# Patient Record
Sex: Female | Born: 1995 | Race: Black or African American | Hispanic: No | Marital: Single | Smoking: Current every day smoker
Health system: Southern US, Community
[De-identification: ages and names within clinical notes are randomized; demographics above are authoritative.]

## PROBLEM LIST (undated history)

## (undated) DIAGNOSIS — N83209 Unspecified ovarian cyst, unspecified side: Secondary | ICD-10-CM

---
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
## 2018-01-16 ENCOUNTER — Encounter: Payer: Self-pay | Admitting: Emergency Medicine

## 2018-01-16 ENCOUNTER — Emergency Department (HOSPITAL_COMMUNITY)
Admission: EM | Admit: 2018-01-16 | Discharge: 2018-01-16 | Disposition: A | Discharge status: Home / Self Care | Payer: Medicaid Other | Attending: Emergency Medicine | Admitting: Emergency Medicine

## 2018-01-16 ENCOUNTER — Other Ambulatory Visit: Payer: Self-pay

## 2018-01-16 DIAGNOSIS — B9689 Other specified bacterial agents as the cause of diseases classified elsewhere: Secondary | ICD-10-CM | POA: Insufficient documentation

## 2018-01-16 DIAGNOSIS — N760 Acute vaginitis: Secondary | ICD-10-CM | POA: Insufficient documentation

## 2018-01-16 DIAGNOSIS — N898 Other specified noninflammatory disorders of vagina: Secondary | ICD-10-CM | POA: Diagnosis present

## 2018-01-16 DIAGNOSIS — A6004 Herpesviral vulvovaginitis: Secondary | ICD-10-CM | POA: Diagnosis not present

## 2018-01-16 DIAGNOSIS — F17200 Nicotine dependence, unspecified, uncomplicated: Secondary | ICD-10-CM | POA: Diagnosis not present

## 2018-01-16 LAB — WET PREP, GENITAL
Sperm: NONE SEEN
TRICH WET PREP: NONE SEEN
YEAST WET PREP: NONE SEEN

## 2018-01-16 LAB — POC URINE PREG, ED: PREG TEST UR: NEGATIVE

## 2018-01-16 MED ORDER — METRONIDAZOLE 500 MG PO TABS: 500.0000 mg | ORAL_TABLET | Freq: Two times a day (BID) | ORAL | 0 refills | Status: DC

## 2018-01-16 MED ORDER — CEFTRIAXONE SODIUM 250 MG IJ SOLR
250.0000 mg | Freq: Once | INTRAMUSCULAR | Status: AC
  Administered 2018-01-16: 250 mg via INTRAMUSCULAR
  Filled 2018-01-16: qty 250

## 2018-01-16 MED ORDER — AZITHROMYCIN 250 MG PO TABS
1000.0000 mg | ORAL_TABLET | Freq: Once | ORAL | Status: AC
  Administered 2018-01-16: 1000 mg via ORAL
  Filled 2018-01-16: qty 4

## 2018-01-16 MED ORDER — VALACYCLOVIR HCL 1 G PO TABS: 1000.0000 mg | ORAL_TABLET | Freq: Two times a day (BID) | ORAL | 0 refills | Status: AC

## 2018-01-16 MED ORDER — LIDOCAINE HCL (PF) 1 % IJ SOLN
INTRAMUSCULAR | Status: AC
  Administered 2018-01-16: 5 mL
  Filled 2018-01-16: qty 5

## 2018-01-16 NOTE — Discharge Instructions (Addendum)
Take Valtrex 1 g twice a day for 7 days.  You will also need to take Flagyl twice a day for 1 week. We will contact you with the results of your remaining lab work when it is available. Return to ED for worsening symptoms, abnormal bleeding, worsening infection.

## 2018-01-16 NOTE — ED Notes (Addendum)
Discharge instructions given to patient. Prescriptions reviewed with patient. Patient removed printed prescription from paperwork and requested RN to shred rest due to not wanting others to see her information.

## 2018-01-16 NOTE — ED Triage Notes (Signed)
Patient presents ambulatory c/o vaginal burning and itching onset of yesterday. Denies any discharge or urinary frequency. Reports recent sexual activity with protection.

## 2018-01-16 NOTE — ED Provider Notes (Signed)
[facility] EMERGENCY DEPARTMENT Provider Note   CSN: [id number] Arrival date & time: 01/16/18  1811     History   Chief Complaint Chief Complaint  Patient presents with  . Vaginal Itching    HPI Amber Hendrix is a 22 y.o. female who presents to ED for vaginal itching that began yesterday.  States that she recently had protected sexual intercourse with a female partner.  She denies history of similar symptoms in the past.  Denies any dysuria, vaginal discharge, abnormal bleeding or possibility of pregnancy.  HPI  History reviewed. No pertinent past medical history.  There are no active problems to display for this patient.   History reviewed. No pertinent surgical history.   OB History   None      Home Medications    Prior to Admission medications   Medication Sig Start Date End Date Taking? Authorizing Provider  metroNIDAZOLE (FLAGYL) 500 MG tablet Take 1 tablet (500 mg total) by mouth 2 (two) times daily. 01/16/18   Frazier Balfour, PA-C  valACYclovir (VALTREX) 1000 MG tablet Take 1 tablet (1,000 mg total) by mouth 2 (two) times daily for 7 days. 01/16/18 01/23/18  Dietrich Pates, PA-C    Family History No family history on file.  Social History Social History   Tobacco Use  . Smoking status: Current Every Day Smoker  . Smokeless tobacco: Never Used  Substance Use Topics  . Alcohol use: Not Currently  . Drug use: Not Currently     Allergies   Patient has no known allergies.   Review of Systems Review of Systems  Constitutional: Negative for chills and fever.  Genitourinary: Negative for dysuria and vaginal discharge.       + Vaginal itching     Physical Exam Updated Vital Signs BP 131/85 (BP Location: Right Arm)   Pulse 88   Temp 98.3 F (36.8 C) (Oral)   Resp 16   Ht 5' 3.5" (1.613 m)   Wt 50.3 kg   LMP 01/14/2018   SpO2 100%   BMI 19.35 kg/m   Physical Exam  Constitutional: She appears well-developed and  well-nourished. No distress.  HENT:  Head: Normocephalic and atraumatic.  Eyes: Conjunctivae and EOM are normal. No scleral icterus.  Neck: Normal range of motion.  Pulmonary/Chest: Effort normal. No respiratory distress.  Genitourinary:    There is lesion on the right labia.  Genitourinary Comments: Pelvic exam: normal external genitalia without evidence of trauma. VULVA: Vesicular lesions noted on right. VAGINA: normal appearing vagina with normal color and discharge, no lesions. CERVIX: normal appearing cervix without lesions, cervical motion tenderness absent, cervical os closed with out purulent discharge; No vaginal discharge. Wet prep and DNA probe for chlamydia and GC obtained.   ADNEXA: normal adnexa in size, nontender and no masses UTERUS: uterus is normal size, shape, consistency and nontender.   Tech served as Biomedical engineer during exam.  Neurological: She is alert.  Skin: No rash noted. She is not diaphoretic.  Psychiatric: She has a normal mood and affect.  Nursing note and vitals reviewed.    ED Treatments / Results  Labs (all labs ordered are listed, but only abnormal results are displayed) Labs Reviewed  WET PREP, GENITAL - Abnormal; Notable for the following components:      Result Value   Clue Cells Wet Prep HPF POC PRESENT (*)    WBC, Wet Prep HPF POC RARE (*)    All other components within normal limits  HIV ANTIBODY (ROUTINE  TESTING W REFLEX)  RPR  POC URINE PREG, ED  GC/CHLAMYDIA PROBE AMP ([place]) NOT AT [facility]    EKG None  Radiology No results found.  Procedures Procedures (including critical care time)  Medications Ordered in ED Medications  cefTRIAXone (ROCEPHIN) injection 250 mg (250 mg Intramuscular Given 01/16/18 1907)  azithromycin (ZITHROMAX) tablet 1,000 mg (1,000 mg Oral Given 01/16/18 1907)  lidocaine (PF) (XYLOCAINE) 1 % injection (5 mLs  Given 01/16/18 1906)     Initial Impression / Assessment and Plan / ED Course  I have  reviewed the triage vital signs and the nursing notes.  Pertinent labs & imaging results that were available during my care of the patient were reviewed by me and considered in my medical decision making (see chart for details).    22 year old female presents to ED for vaginal itching since yesterday.  Denies any urinary symptoms, vaginal discharge.  She reports having protected sexual intercourse with a female partner recently.  On exam there are vesicular lesions that are concerning for herpes.  Wet prep shows clue cells.  Pregnancy test was negative.  Patient treated prophylactically for gonorrhea and chlamydia and will be given a prescription for Valtrex, Flagyl.  Patient is hemodynamically stable, in NAD, and able to ambulate in the ED. Evaluation does not show pathology that would require ongoing emergent intervention or inpatient treatment. I explained the diagnosis to the patient. Pain has been managed and has no complaints prior to discharge. Patient is comfortable with above plan and is stable for discharge at this time. All questions were answered prior to disposition. Strict return precautions for returning to the ED were discussed. Encouraged follow up with PCP.    Portions of this note were generated with Scientist, clinical (histocompatibility and immunogenetics). Dictation errors may occur despite best attempts at proofreading.   Final Clinical Impressions(s) / ED Diagnoses   Final diagnoses:  Herpes simplex vulvovaginitis  BV (bacterial vaginosis)    ED Discharge Orders         Ordered    valACYclovir (VALTREX) 1000 MG tablet  2 times daily     01/16/18 1908    metroNIDAZOLE (FLAGYL) 500 MG tablet  2 times daily     01/16/18 1908           Dietrich Pates, [place] 01/16/18 1908    Tegeler, Canary Brim, MD 01/16/18 [phone]

## 2018-01-17 LAB — RPR: RPR Ser Ql: NONREACTIVE

## 2018-01-17 LAB — HIV ANTIBODY (ROUTINE TESTING W REFLEX): HIV Screen 4th Generation wRfx: NONREACTIVE

## 2018-01-17 LAB — GC/CHLAMYDIA PROBE AMP (~~LOC~~) NOT AT ARMC
Chlamydia: NEGATIVE
NEISSERIA GONORRHEA: NEGATIVE

## 2018-09-20 ENCOUNTER — Emergency Department (HOSPITAL_COMMUNITY)
Admission: EM | Admit: 2018-09-20 | Discharge: 2018-09-21 | Disposition: A | Discharge status: Home / Self Care | Payer: Medicaid Other | Attending: Emergency Medicine | Admitting: Emergency Medicine

## 2018-09-20 ENCOUNTER — Emergency Department (HOSPITAL_COMMUNITY): Payer: Medicaid Other

## 2018-09-20 ENCOUNTER — Encounter (HOSPITAL_COMMUNITY): Payer: Self-pay

## 2018-09-20 ENCOUNTER — Other Ambulatory Visit: Payer: Self-pay

## 2018-09-20 DIAGNOSIS — N3090 Cystitis, unspecified without hematuria: Secondary | ICD-10-CM | POA: Insufficient documentation

## 2018-09-20 DIAGNOSIS — R1030 Lower abdominal pain, unspecified: Secondary | ICD-10-CM | POA: Diagnosis present

## 2018-09-20 LAB — CBC
HCT: 35 % — ABNORMAL LOW (ref 36.0–46.0)
Hemoglobin: 10.9 g/dL — ABNORMAL LOW (ref 12.0–15.0)
MCH: 26.9 pg (ref 26.0–34.0)
MCHC: 31.1 g/dL (ref 30.0–36.0)
MCV: 86.4 fL (ref 80.0–100.0)
Platelets: 259 10*3/uL (ref 150–400)
RBC: 4.05 MIL/uL (ref 3.87–5.11)
RDW: 15.9 % — ABNORMAL HIGH (ref 11.5–15.5)
WBC: 9.3 10*3/uL (ref 4.0–10.5)
nRBC: 0 % (ref 0.0–0.2)

## 2018-09-20 LAB — COMPREHENSIVE METABOLIC PANEL
ALT: 11 U/L (ref 0–44)
AST: 18 U/L (ref 15–41)
Albumin: 3.9 g/dL (ref 3.5–5.0)
Alkaline Phosphatase: 48 U/L (ref 38–126)
Anion gap: 9 (ref 5–15)
BUN: 8 mg/dL (ref 6–20)
CO2: 23 mmol/L (ref 22–32)
Calcium: 9.1 mg/dL (ref 8.9–10.3)
Chloride: 103 mmol/L (ref 98–111)
Creatinine, Ser: 0.84 mg/dL (ref 0.44–1.00)
GFR calc Af Amer: >60 mL/min (ref >60)
GFR calc non Af Amer: >60 mL/min (ref >60)
Glucose, Bld: 92 mg/dL (ref 70–99)
Potassium: 3.8 mmol/L (ref 3.5–5.1)
Sodium: 135 mmol/L (ref 135–145)
Total Bilirubin: 0.7 mg/dL (ref 0.3–1.2)
Total Protein: 7.6 g/dL (ref 6.5–8.1)

## 2018-09-20 LAB — URINALYSIS, ROUTINE W REFLEX MICROSCOPIC
Bacteria, UA: NONE SEEN
Bilirubin Urine: NEGATIVE
Glucose, UA: NEGATIVE mg/dL
Hgb urine dipstick: NEGATIVE
Ketones, ur: NEGATIVE mg/dL
Nitrite: NEGATIVE
Protein, ur: 30 mg/dL — AB
Specific Gravity, Urine: 1.023 (ref 1.005–1.030)
pH: 7 (ref 5.0–8.0)

## 2018-09-20 LAB — I-STAT BETA HCG BLOOD, ED (MC, WL, AP ONLY): I-stat hCG, quantitative: <5 m[IU]/mL (ref <5)

## 2018-09-20 LAB — LIPASE, BLOOD: Lipase: 23 U/L (ref 11–51)

## 2018-09-20 MED ORDER — ONDANSETRON HCL 4 MG/2ML IJ SOLN
4.0000 mg | Freq: Once | INTRAMUSCULAR | Status: AC
  Administered 2018-09-20: 23:00:00 4 mg via INTRAVENOUS
  Filled 2018-09-20: qty 2

## 2018-09-20 MED ORDER — MORPHINE SULFATE (PF) 4 MG/ML IV SOLN
4.0000 mg | Freq: Once | INTRAVENOUS | Status: AC
  Administered 2018-09-20: 23:00:00 4 mg via INTRAVENOUS
  Filled 2018-09-20: qty 1

## 2018-09-20 MED ORDER — IOHEXOL 300 MG/ML  SOLN
100.0000 mL | Freq: Once | INTRAMUSCULAR | Status: AC | PRN
  Administered 2018-09-20: 23:00:00 100 mL via INTRAVENOUS

## 2018-09-20 MED ORDER — SODIUM CHLORIDE 0.9 % IV SOLN
1.0000 g | Freq: Once | INTRAVENOUS | Status: AC
  Administered 2018-09-21: 1 g via INTRAVENOUS
  Filled 2018-09-20: qty 10

## 2018-09-20 MED ORDER — SODIUM CHLORIDE 0.9% FLUSH: 3.0000 mL | Freq: Once | INTRAVENOUS | Status: DC

## 2018-09-20 NOTE — ED Triage Notes (Signed)
Pt reports sudden onset of RLQ pain last night, area is distended, very tender to touch. Denies n/vd.

## 2018-09-20 NOTE — ED Provider Notes (Signed)
[facility] EMERGENCY DEPARTMENT Provider Note   CSN: [id number] Arrival date & time: 09/20/18  2150     History   Chief Complaint Chief Complaint  Patient presents with  . Abdominal Pain    HPI Amber Hendrix is a 23 y.o. female.     Patient presents to the ED with a chief complaint of abdominal pain.  She states that the pain started yesterday and has been gradually worsening.  She denies any nausea, vomiting, or diarrhea.  Denies any hematuria, dysuria, vaginal discharge or bleeding.  She has not taken anything for her symptoms.  She rates her pain as "making her cry."  She denies any other associated symptoms.  The history is provided by the patient. No language interpreter was used.    History reviewed. No pertinent past medical history.  There are no active problems to display for this patient.   History reviewed. No pertinent surgical history.   OB History   No obstetric history on file.      Home Medications    Prior to Admission medications   Not on File    Family History No family history on file.  Social History Social History   Tobacco Use  . Smoking status: Not on file  Substance Use Topics  . Alcohol use: Not on file  . Drug use: Not on file     Allergies   Patient has no allergy information on record.   Review of Systems Review of Systems  All other systems reviewed and are negative.    Physical Exam Updated Vital Signs BP 110/69 (BP Location: Left Arm)   Pulse 100   Temp 98.7 F (37.1 C) (Oral)   Resp 14   LMP 09/14/2018   SpO2 100%   Physical Exam Vitals signs and nursing note reviewed.  Constitutional:      General: She is not in acute distress.    Appearance: She is well-developed.  HENT:     Head: Normocephalic and atraumatic.  Eyes:     Conjunctiva/sclera: Conjunctivae normal.  Neck:     Musculoskeletal: Neck supple.  Cardiovascular:     Rate and Rhythm: Normal rate and regular rhythm.      Heart sounds: No murmur.  Pulmonary:     Effort: Pulmonary effort is normal. No respiratory distress.     Breath sounds: Normal breath sounds.  Abdominal:     Palpations: Abdomen is soft.     Tenderness: There is abdominal tenderness.     Comments: Suprapubic tenderness  Skin:    General: Skin is warm and dry.  Neurological:     Mental Status: She is alert and oriented to person, place, and time.  Psychiatric:        Mood and Affect: Mood normal.        Behavior: Behavior normal.        Thought Content: Thought content normal.        Judgment: Judgment normal.      ED Treatments / Results  Labs (all labs ordered are listed, but only abnormal results are displayed) Labs Reviewed  CBC - Abnormal; Notable for the following components:      Result Value   Hemoglobin 10.9 (*)    HCT 35.0 (*)    RDW 15.9 (*)    All other components within normal limits  LIPASE, BLOOD  COMPREHENSIVE METABOLIC PANEL  URINALYSIS, ROUTINE W REFLEX MICROSCOPIC  I-STAT BETA HCG BLOOD, ED ([facility], [facility], [facility] ONLY)  EKG None  Radiology No results found.  Procedures Procedures (including critical care time)  Medications Ordered in ED Medications  sodium chloride flush (NS) 0.9 % injection 3 mL (has no administration in time range)  morphine 4 MG/ML injection 4 mg (has no administration in time range)  ondansetron (ZOFRAN) injection 4 mg (has no administration in time range)     Initial Impression / Assessment and Plan / ED Course  I have reviewed the triage vital signs and the nursing notes.  Pertinent labs & imaging results that were available during my care of the patient were reviewed by me and considered in my medical decision making (see chart for details).        Patient with lower abdominal pain.  Symptoms started yesterday.  Pain is mostly in the suprapubic.  She denies any dysuria.  Denies any fever, nausea, or vomiting.  Denies vaginal discharge or bleeding.  We will check CT  to rule out appendicitis.  Urinalysis concerning for UTI.  CT shows inflammatory changes which could be consistent with cystitis.  Also consider PID, however the patient adamantly denies any vaginal discharge.  Doubt abscess.  She is afebrile.  She is nontoxic-appearing.  Will treat for cystitis at this time, and will have patient follow-up with PCP or OB/GYN should she develop vaginal discharge.  Additionally, she has been instructed to return to the emergency department if her symptoms worsen.  Final Clinical Impressions(s) / ED Diagnoses   Final diagnoses:  Cystitis    ED Discharge Orders         Ordered    cephALEXin (KEFLEX) 500 MG capsule  2 times daily     09/21/18 0031    ibuprofen (ADVIL) 800 MG tablet  3 times daily     09/21/18 0031           Roxy HorsemanBrowning, Peityn Payton, PA-C 09/21/18 0034    Gerhard MunchLockwood, Eugina Row, MD 09/26/18 [phone]

## 2018-09-20 NOTE — ED Notes (Signed)
Attempted to obtain IV access x2 unsuccessful. Second RN to attempt

## 2018-09-20 NOTE — ED Notes (Signed)
Patient transported to CT 

## 2018-09-21 ENCOUNTER — Encounter: Payer: Self-pay | Admitting: Emergency Medicine

## 2018-09-21 MED ORDER — IBUPROFEN 800 MG PO TABS: 800.0000 mg | ORAL_TABLET | Freq: Three times a day (TID) | ORAL | 0 refills | Status: DC

## 2018-09-21 MED ORDER — CEPHALEXIN 500 MG PO CAPS: 500.0000 mg | ORAL_CAPSULE | Freq: Two times a day (BID) | ORAL | 0 refills | Status: DC

## 2018-09-21 NOTE — ED Notes (Signed)
Discharge instructions reinforced with pt. Pt states having to drive home to [facility]. Pt to sleep in hallway before going home.

## 2018-09-21 NOTE — ED Notes (Signed)
Discharge instructions discussed with pt. Pt verbalized understanding. No questions at this time   Pending completion of IV antibiotics.

## 2018-09-21 NOTE — ED Notes (Signed)
Pt continues sleeping in hall.

## 2018-10-21 ENCOUNTER — Emergency Department
Admission: EM | Admit: 2018-10-21 | Discharge: 2018-10-21 | Disposition: A | Discharge status: Home / Self Care | Payer: Medicaid Other | Attending: Emergency Medicine | Admitting: Emergency Medicine

## 2018-10-21 ENCOUNTER — Other Ambulatory Visit: Payer: Self-pay

## 2018-10-21 DIAGNOSIS — Y9301 Activity, walking, marching and hiking: Secondary | ICD-10-CM | POA: Insufficient documentation

## 2018-10-21 DIAGNOSIS — F17210 Nicotine dependence, cigarettes, uncomplicated: Secondary | ICD-10-CM | POA: Insufficient documentation

## 2018-10-21 DIAGNOSIS — W01190A Fall on same level from slipping, tripping and stumbling with subsequent striking against furniture, initial encounter: Secondary | ICD-10-CM | POA: Diagnosis not present

## 2018-10-21 DIAGNOSIS — Y929 Unspecified place or not applicable: Secondary | ICD-10-CM | POA: Diagnosis not present

## 2018-10-21 DIAGNOSIS — Y999 Unspecified external cause status: Secondary | ICD-10-CM | POA: Insufficient documentation

## 2018-10-21 DIAGNOSIS — S0181XA Laceration without foreign body of other part of head, initial encounter: Secondary | ICD-10-CM

## 2018-10-21 MED ORDER — LIDOCAINE-EPINEPHRINE (PF) 2 %-1:200000 IJ SOLN
10.0000 mL | Freq: Once | INTRAMUSCULAR | Status: DC
  Filled 2018-10-21: qty 10

## 2018-10-21 MED ORDER — ONDANSETRON 4 MG PO TBDP
4.0000 mg | ORAL_TABLET | Freq: Once | ORAL | Status: AC
  Administered 2018-10-21: 16:00:00 4 mg via ORAL
  Filled 2018-10-21: qty 1

## 2018-10-21 MED ORDER — LIDOCAINE HCL (PF) 1 % IJ SOLN
5.0000 mL | Freq: Once | INTRAMUSCULAR | Status: AC
  Administered 2018-10-21: 5 mL
  Filled 2018-10-21: qty 5

## 2018-10-21 MED ORDER — NAPROXEN 500 MG PO TABS
500.0000 mg | ORAL_TABLET | Freq: Once | ORAL | Status: DC
  Filled 2018-10-21: qty 1

## 2018-10-21 MED ORDER — ALPRAZOLAM 0.5 MG PO TABS
0.5000 mg | ORAL_TABLET | Freq: Once | ORAL | Status: DC
  Filled 2018-10-21: qty 1

## 2018-10-21 MED ORDER — ALPRAZOLAM 0.5 MG PO TABS
0.5000 mg | ORAL_TABLET | Freq: Once | ORAL | Status: AC
  Administered 2018-10-21: 0.5 mg via ORAL
  Filled 2018-10-21: qty 1

## 2018-10-21 NOTE — Discharge Instructions (Signed)
We repaired your facial laceration with dissolvable sutures and wound glue on the skin. Keep the are clean and dry. Avoid using creams, lotions, oils, or ointments on the wound glue. The glue will crack and peel off in a week or so. Follow-up with one of the local community clinics as needed. Take Tylenol or Motrin for headache pain relief.

## 2018-10-21 NOTE — ED Provider Notes (Signed)
[facility] Department Provider Note ____________________________________________  Time seen: 1530  I have reviewed the triage vital signs and the nursing notes.  HISTORY  Chief Complaint  Fall and Facial Laceration  HPI Amber Hendrix is a 23 y.o. female presents for a facial laceration after contusion. She recalls a mechanical fall where she hit her face on a wooden chair. She sustained a deep laceration over the right brow and the central forehead. She denies LOC, vomiting, or other injury. Initial care and eval provided by my colleague. I will assume repair of the suture at the request of the patient.   History reviewed. No pertinent past medical history.  There are no active problems to display for this patient.  History reviewed. No pertinent surgical history.  Prior to Admission medications   Not on File    Allergies Patient has no known allergies.  No family history on file.  Social History Social History   Tobacco Use  . Smoking status: Current Every Day Smoker  . Smokeless tobacco: Never Used  Substance Use Topics  . Alcohol use: Not Currently  . Drug use: Not Currently    Review of Systems  Constitutional: Negative for fever. Eyes: Negative for visual changes. ENT: Negative for sore throat. Cardiovascular: Negative for chest pain. Respiratory: Negative for shortness of breath. Gastrointestinal: Negative for abdominal pain, vomiting and diarrhea. Genitourinary: Negative for dysuria. Musculoskeletal: Negative for back pain. Skin: Negative for rash. Facial lac as above Neurological: Negative for headaches, focal weakness or numbness. ____________________________________________  PHYSICAL EXAM:  VITAL SIGNS: ED Triage Vitals  Enc Vitals Group     BP 10/21/18 1259 110/77     Pulse Rate 10/21/18 1259 86     Resp 10/21/18 1259 18     Temp 10/21/18 1259 98.4 Hendrix (36.9 C)     Temp Source 10/21/18 1259 Oral   SpO2 10/21/18 1259 99 %     Weight 10/21/18 1300 110 lb (49.9 kg)     Height 10/21/18 1300 5\' 4"  (1.626 m)     Head Circumference --      Peak Flow --      Pain Score 10/21/18 1259 10     Pain Loc --      Pain Edu? --      Excl. in [place]? --     Constitutional: Alert and oriented. Well appearing and in no distress. Head: Normocephalic and atraumatic, except for a large wound vertical lie across the central forehead at the medial right brow.  Subcutaneous fascia is visible in the wound bed.  No active bleeding is appreciated.. Eyes: Conjunctivae are normal. PERRL. Normal extraocular movements Ears: Canals clear. TMs intact bilaterally. Nose: No congestion/rhinorrhea/epistaxis. Mouth/Throat: Mucous membranes are moist. Neck: Supple. Normal ROM Cardiovascular: Normal rate, regular rhythm. Normal distal pulses. Respiratory: Normal respiratory effort. Musculoskeletal: Nontender with normal range of motion in all extremities.  Neurologic: CN II-XII grossly intact. Normal gait without ataxia. Normal speech and language. No gross focal neurologic deficits are appreciated. Skin:  Skin is warm, dry and intact. No rash noted. Psychiatric: Mood and affect are normal. Patient exhibits appropriate insight and judgment. ____________________________________________  PROCEDURES  Xanax 0.5 mg PO Zofran 4 mg ODT  .Marland KitchenLaceration Repair  Date/Time: 10/21/2018 3:35 PM Performed by: Melvenia Needles, PA-C Authorized by: Melvenia Needles, PA-C   Consent:    Consent obtained:  Verbal   Consent given by:  Patient   Risks discussed:  Poor wound healing  and poor cosmetic result Anesthesia (see MAR for exact dosages):    Anesthesia method:  Local infiltration   Local anesthetic:  Lidocaine 1% w/o epi Laceration details:    Location:  Face   Face location:  Forehead   Length (cm):  3   Depth (mm):  3 Pre-procedure details:    Preparation:  Patient was prepped and draped in usual sterile  fashion Exploration:    Contaminated: no   Treatment:    Area cleansed with:  Saline and Betadine   Amount of cleaning:  Standard Skin repair:    Repair method:  Sutures and tissue adhesive   Suture size:  6-0   Suture material:  Fast-absorbing gut   Suture technique:  Subcuticular and running   Number of sutures:  1 Approximation:    Approximation:  Close Post-procedure details:    Patient tolerance of procedure:  Tolerated well, no immediate complications  ____________________________________________  INITIAL IMPRESSION / ASSESSMENT AND PLAN / ED COURSE  Amber Hendrix was evaluated in Emergency Department on 10/21/2018 for the symptoms described in the history of present illness. She was evaluated in the context of the global COVID-19 pandemic, which necessitated consideration that the patient might be at risk for infection with the SARS-CoV-2 virus that causes COVID-19. Institutional protocols and algorithms that pertain to the evaluation of patients at risk for COVID-19 are in a state of rapid change based on information released by regulatory bodies including the CDC and federal and state organizations. These policies and algorithms were followed during the patient's care in the ED.  Patient with ED evaluation of a facial laceration after a mechanical fall. I assumed care for the laceration repair. She patient is discharged to the care of her sister with wound care instructions.  ____________________________________________  FINAL CLINICAL IMPRESSION(S) / ED DIAGNOSES  Final diagnoses:  Facial laceration, initial encounter      Amber [facility]rix, Amber Canevari V Bacon, PA-C 10/21/18 1631    Arnaldo NatalMalinda, Paul F, MD 10/21/18 2306

## 2018-10-21 NOTE — ED Notes (Signed)
Went into room to give pain medication. Pt not in room. Per HouseKeeping pt was seen walking down hall towards discharge.

## 2018-10-21 NOTE — ED Provider Notes (Signed)
[facility] Emergency Department Provider Note  ____________________________________________  Time seen: Approximately 1:52 PM  I have reviewed the triage vital signs and the nursing notes.   HISTORY  Chief Complaint Fall and Facial Laceration   HPI Amber Hendrix is a 23 y.o. female who presents to the emergency department for treatment and evaluation of a laceration above the right eyebrow. She tripped, fell and hit her head on a chair. No alleviating measures prior to arrival.  History reviewed. No pertinent past medical history.  There are no active problems to display for this patient.   History reviewed. No pertinent surgical history.  Prior to Admission medications   Not on File    Allergies Patient has no known allergies.  No family history on file.  Social History Social History   Tobacco Use  . Smoking status: Current Every Day Smoker  . Smokeless tobacco: Never Used  Substance Use Topics  . Alcohol use: Not Currently  . Drug use: Not Currently    Review of Systems  Constitutional: Negative for fever. Respiratory: Negative for cough or shortness of breath.  Musculoskeletal: Negative for myalgias Skin: Positive for facial laceration.  Neurological: Negative for numbness or paresthesias. ____________________________________________   PHYSICAL EXAM:  VITAL SIGNS: ED Triage Vitals  Enc Vitals Group     BP 10/21/18 1259 110/77     Pulse Rate 10/21/18 1259 86     Resp 10/21/18 1259 18     Temp 10/21/18 1259 98.4 F (36.9 C)     Temp Source 10/21/18 1259 Oral     SpO2 10/21/18 1259 99 %     Weight 10/21/18 1300 110 lb (49.9 kg)     Height 10/21/18 1300 5\' 4"  (1.626 m)     Head Circumference --      Peak Flow --      Pain Score 10/21/18 1259 10     Pain Loc --      Pain Edu? --      Excl. in [place]? --      Constitutional: Well appearing. Eyes: Conjunctivae are clear without discharge or drainage. Nose: No  rhinorrhea noted. Mouth/Throat: Airway is patent.  Neck: No stridor. Unrestricted range of motion observed. Cardiovascular: Capillary refill is <3 seconds.  Respiratory: Respirations are even and unlabored.. Musculoskeletal: Unrestricted range of motion observed. Neurologic: Awake, alert, and oriented x 4.  Skin:  3cm laceration to the forehead.  ____________________________________________   LABS (all labs ordered are listed, but only abnormal results are displayed)  Labs Reviewed - No data to display ____________________________________________  EKG  Not indicated. ____________________________________________  RADIOLOGY  Not indicated. ____________________________________________   PROCEDURES  Procedures ____________________________________________   INITIAL IMPRESSION / ASSESSMENT AND PLAN / ED COURSE  Amber [facility]rix is a 23 y.o. female who presents to the ER after falling and hitting her head. She has a 3cm laceration to the right forehead next to the eyebrow on the medial aspect. While trying to get the area numbed up, she started screaming for me to stop and was pulling away. I stopped and she continued to scream at me for being "negative". I am unsure what exactly caused her reaction and attempted to apologize. She was initially going to just leave, but I advised her that she would have a very bad scar if she didn't get it taken care of.  She is refusing to have the sutures inserted by me. She has asked for pain medication and something for anxiety which  I have ordered. Will await another provider to insert sutures. She states that she will wait.   ----------------------------------------- 2:39 PM on 10/21/2018 -----------------------------------------  Patient seen walking out of the department with her belongings.   ----------------------------------------- 3:10 PM on 10/21/2018 ----------------------------------------- Patient returned to the  ER. Patient care transferred to [facility], PA-C.  Medications - No data to display   Pertinent labs & imaging results that were available during my care of the patient were reviewed by me and considered in my medical decision making (see chart for details).  ____________________________________________   FINAL CLINICAL IMPRESSION(S) / ED DIAGNOSES  Final diagnoses:  Facial laceration, initial encounter    ED Discharge Orders    None       Note:  This document was prepared using Dragon voice recognition software and may include unintentional dictation errors.   Victorino Dike, FNP 10/21/18 1512    Delman Kitten, MD 10/21/18 [phone]

## 2018-10-21 NOTE — ED Notes (Signed)
See triage note   States she tripped    Hitting face on chair  Laceration noted to forehead  No LOC and denies any other pain

## 2018-10-21 NOTE — ED Triage Notes (Signed)
Reports trip and fall at home, causing her to hit forehead on wooden chair. Laceration above right eyebrow. No LOC. Pt alert and oriented X4, active, cooperative, pt in NAD. RR even and unlabored, color WNL.

## 2018-10-21 NOTE — ED Notes (Signed)
Pt returned from lobby   Placed in room   Provider aware

## 2019-03-26 ENCOUNTER — Emergency Department
Admission: EM | Admit: 2019-03-26 | Discharge: 2019-03-26 | Disposition: A | Discharge status: Home / Self Care | Payer: Medicaid Other | Attending: Emergency Medicine | Admitting: Emergency Medicine

## 2019-03-26 ENCOUNTER — Other Ambulatory Visit: Payer: Self-pay

## 2019-03-26 DIAGNOSIS — N898 Other specified noninflammatory disorders of vagina: Secondary | ICD-10-CM | POA: Diagnosis present

## 2019-03-26 DIAGNOSIS — F17200 Nicotine dependence, unspecified, uncomplicated: Secondary | ICD-10-CM | POA: Insufficient documentation

## 2019-03-26 MED ORDER — SULFAMETHOXAZOLE-TRIMETHOPRIM 800-160 MG PO TABS: 1.0000 | ORAL_TABLET | Freq: Two times a day (BID) | ORAL | 0 refills | Status: DC

## 2019-03-26 NOTE — ED Triage Notes (Signed)
Pt arrived via POV with reports of a "hair bump" around pubic area x 2 days. Pt denies any drainage c/o pain.

## 2019-03-26 NOTE — ED Provider Notes (Signed)
[facility] Emergency Department Provider Note  ____________________________________________  Time seen: Approximately 8:08 PM  I have reviewed the triage vital signs and the nursing notes.   HISTORY  Chief Complaint Abscess (Pubic)    HPI Amber Hendrix is a 24 y.o. female that presents to the emergency department for evaluation of lump to left labia for 3 days.  Patient states that she recently changed her soap.  Patient shaves area.  She has had some itching around her pubic hair.  She has never had anything like this before.  No new vaginal discharge or vaginal itching.  No fevers.  No past medical history on file.  There are no problems to display for this patient.   No past surgical history on file.  Prior to Admission medications   Medication Sig Start Date End Date Taking? Authorizing Provider  sulfamethoxazole-trimethoprim (BACTRIM DS) 800-160 MG tablet Take 1 tablet by mouth 2 (two) times daily. 03/26/19   Laban Emperor, PA-C    Allergies Patient has no known allergies.  No family history on file.  Social History Social History   Tobacco Use  . Smoking status: Current Every Day Smoker  . Smokeless tobacco: Never Used  Substance Use Topics  . Alcohol use: Not Currently  . Drug use: Not Currently     Review of Systems  Constitutional: No fever/chills Respiratory: No SOB. Gastrointestinal: No abdominal pain.  No nausea, no vomiting.  Musculoskeletal: Negative for musculoskeletal pain. Skin: Negative for abrasions, lacerations, ecchymosis.    ____________________________________________   PHYSICAL EXAM:  VITAL SIGNS: ED Triage Vitals  Enc Vitals Group     BP 03/26/19 1855 120/73     Pulse Rate 03/26/19 1855 62     Resp 03/26/19 1855 18     Temp 03/26/19 1855 98.2 F (36.8 C)     Temp Source 03/26/19 1855 Oral     SpO2 03/26/19 1855 100 %     Weight 03/26/19 1856 120 lb (54.4 kg)     Height 03/26/19 1856 5\' 3"   (1.6 m)     Head Circumference --      Peak Flow --      Pain Score 03/26/19 1855 5     Pain Loc --      Pain Edu? --      Excl. in [place]? --      Constitutional: Alert and oriented. Well appearing and in no acute distress. Eyes: Conjunctivae are normal. PERRL. EOMI. Head: Atraumatic. ENT:      Ears:      Nose: No congestion/rhinnorhea.      Mouth/Throat: Mucous membranes are moist.  Neck: No stridor.  Cardiovascular: Normal rate, regular rhythm.  Good peripheral circulation. Respiratory: Normal respiratory effort without tachypnea or retractions. Lungs CTAB. Good air entry to the bases with no decreased or absent breath sounds. Musculoskeletal: Full range of motion to all extremities. No gross deformities appreciated. Genitourinary: Pea-sized mass proximal to left vaginal opening.  No tenderness to palpation.  No drainage.  No fluctuance.  No vesicles or lesions. Neurologic:  Normal speech and language. No gross focal neurologic deficits are appreciated.  Skin:  Skin is warm, dry. Psychiatric: Mood and affect are normal. Speech and behavior are normal. Patient exhibits appropriate insight and judgement.   ____________________________________________   LABS (all labs ordered are listed, but only abnormal results are displayed)  Labs Reviewed - No data to display ____________________________________________  EKG   ____________________________________________  RADIOLOGY   No results found.  ____________________________________________    PROCEDURES  Procedure(s) performed:    Procedures    Medications - No data to display   ____________________________________________   INITIAL IMPRESSION / ASSESSMENT AND PLAN / ED COURSE  Pertinent labs & imaging results that were available during my care of the patient were reviewed by me and considered in my medical decision making (see chart for details).  Review of the  CSRS was performed in accordance of the [facility]  prior to dispensing any controlled drugs.   Patient presented to emergency department for evaluation of small mass to left labia for 3 days.  Vital signs and exam are reassuring.  Patient has a pea size mass to the left of her vaginal opening.  May be a developing infection from an ingrown hair or Bartholin cyst.  No drainable abscess.  Patient will be discharged home with prescriptions for Bactrim. Patient is to follow up with gynecology as directed. Patient is given ED precautions to return to the ED for any worsening or new symptoms.  Amber Hendrix was evaluated in Emergency Department on 03/26/2019 for the symptoms described in the history of present illness. She was evaluated in the context of the global COVID-19 pandemic, which necessitated consideration that the patient might be at risk for infection with the SARS-CoV-2 virus that causes COVID-19. Institutional protocols and algorithms that pertain to the evaluation of patients at risk for COVID-19 are in a state of rapid change based on information released by regulatory bodies including the CDC and federal and state organizations. These policies and algorithms were followed during the patient's care in the ED.   ____________________________________________  FINAL CLINICAL IMPRESSION(S) / ED DIAGNOSES  Final diagnoses:  Vaginal mass      NEW MEDICATIONS STARTED DURING THIS VISIT:  ED Discharge Orders         Ordered    sulfamethoxazole-trimethoprim (BACTRIM DS) 800-160 MG tablet  2 times daily     03/26/19 2046              This chart was dictated using voice recognition software/Dragon. Despite best efforts to proofread, errors can occur which can change the meaning. Any change was purely unintentional.    Enid Derry, PA-C 03/26/19 2317    Shaune Pollack, MD 03/27/19 [phone]

## 2019-03-26 NOTE — Discharge Instructions (Addendum)
It looks like you are developing a small infection, possibly from an ingrown hair or a clogged gland.  Please change back to your regular soap.  Do not shave.  Apply warm compresses.  Please begin antibiotics.  Please call gynecology tomorrow for a follow-up appointment this week.

## 2019-04-19 ENCOUNTER — Ambulatory Visit: Payer: Medicaid Other | Admitting: Obstetrics and Gynecology

## 2019-05-16 ENCOUNTER — Telehealth: Payer: Self-pay | Admitting: Obstetrics and Gynecology

## 2019-05-16 NOTE — Telephone Encounter (Signed)
Called patient to see if she wanted to reschedule her appointment she wasn't able to come to. Was not successful reaching patient, and wasn't able to leave a message for patient to return my call.

## 2019-09-01 ENCOUNTER — Emergency Department
Admission: EM | Admit: 2019-09-01 | Discharge: 2019-09-02 | Disposition: A | Discharge status: Home / Self Care | Payer: Medicaid Other | Attending: Emergency Medicine | Admitting: Emergency Medicine

## 2019-09-01 ENCOUNTER — Other Ambulatory Visit: Payer: Self-pay

## 2019-09-01 ENCOUNTER — Emergency Department: Payer: Medicaid Other

## 2019-09-01 DIAGNOSIS — R102 Pelvic and perineal pain: Secondary | ICD-10-CM

## 2019-09-01 DIAGNOSIS — R1030 Lower abdominal pain, unspecified: Secondary | ICD-10-CM | POA: Diagnosis present

## 2019-09-01 DIAGNOSIS — F17210 Nicotine dependence, cigarettes, uncomplicated: Secondary | ICD-10-CM | POA: Diagnosis not present

## 2019-09-01 DIAGNOSIS — N83201 Unspecified ovarian cyst, right side: Secondary | ICD-10-CM | POA: Insufficient documentation

## 2019-09-01 DIAGNOSIS — N83209 Unspecified ovarian cyst, unspecified side: Secondary | ICD-10-CM

## 2019-09-01 HISTORY — DX: Unspecified ovarian cyst, unspecified side: N83.209

## 2019-09-01 LAB — URINALYSIS, COMPLETE (UACMP) WITH MICROSCOPIC
Bacteria, UA: NONE SEEN
Bilirubin Urine: NEGATIVE
Glucose, UA: NEGATIVE mg/dL
Ketones, ur: NEGATIVE mg/dL
Leukocytes,Ua: NEGATIVE
Nitrite: NEGATIVE
Protein, ur: NEGATIVE mg/dL
Specific Gravity, Urine: 1.006 (ref 1.005–1.030)
pH: 7 (ref 5.0–8.0)

## 2019-09-01 LAB — CBC
HCT: 36.9 % (ref 36.0–46.0)
Hemoglobin: 11.7 g/dL — ABNORMAL LOW (ref 12.0–15.0)
MCH: 27.8 pg (ref 26.0–34.0)
MCHC: 31.7 g/dL (ref 30.0–36.0)
MCV: 87.6 fL (ref 80.0–100.0)
Platelets: 173 10*3/uL (ref 150–400)
RBC: 4.21 MIL/uL (ref 3.87–5.11)
RDW: 14 % (ref 11.5–15.5)
WBC: 5.9 10*3/uL (ref 4.0–10.5)
nRBC: 0 % (ref 0.0–0.2)

## 2019-09-01 LAB — COMPREHENSIVE METABOLIC PANEL
ALT: 14 U/L (ref 0–44)
AST: 23 U/L (ref 15–41)
Albumin: 4.4 g/dL (ref 3.5–5.0)
Alkaline Phosphatase: 55 U/L (ref 38–126)
Anion gap: 4 — ABNORMAL LOW (ref 5–15)
BUN: 8 mg/dL (ref 6–20)
CO2: 32 mmol/L (ref 22–32)
Calcium: 9.1 mg/dL (ref 8.9–10.3)
Chloride: 105 mmol/L (ref 98–111)
Creatinine, Ser: 0.83 mg/dL (ref 0.44–1.00)
GFR calc Af Amer: >60 mL/min (ref >60)
GFR calc non Af Amer: >60 mL/min (ref >60)
Glucose, Bld: 83 mg/dL (ref 70–99)
Potassium: 3.7 mmol/L (ref 3.5–5.1)
Sodium: 141 mmol/L (ref 135–145)
Total Bilirubin: 0.5 mg/dL (ref 0.3–1.2)
Total Protein: 7.8 g/dL (ref 6.5–8.1)

## 2019-09-01 LAB — LIPASE, BLOOD: Lipase: 35 U/L (ref 11–51)

## 2019-09-01 LAB — PREGNANCY, URINE: Preg Test, Ur: NEGATIVE

## 2019-09-01 MED ORDER — KETOROLAC TROMETHAMINE 60 MG/2ML IM SOLN
30.0000 mg | Freq: Once | INTRAMUSCULAR | Status: AC
  Administered 2019-09-02: 30 mg via INTRAMUSCULAR
  Filled 2019-09-01: qty 2

## 2019-09-01 NOTE — ED Provider Notes (Signed)
[facility] Emergency Department Provider Note   ____________________________________________   I have reviewed the triage vital signs and the nursing notes.   HISTORY  Chief Complaint Abdominal Pain   History limited by: Not Limited   HPI Amber Hendrix is a 24 y.o. female who presents to the emergency department today because of concerns for lower abdominal pain.  Patient states that the pain started this morning.  Has been fairly constant throughout the day.  She has not noticed anything that particular makes the pain worse.  It will be severe.  Has not noticed any dysuria or abnormal vaginal discharge with the pain.  She denies any change in her bowel movements.  Denies any significant vomiting although she has had some nausea.  Patient states she has had similar pain in the past was told she had a cyst.   Records reviewed. Per medical record review patient has a history of ovarian cyst.  Past Medical History:  Diagnosis Date  . Ovarian cyst     There are no problems to display for this patient.   History reviewed. No pertinent surgical history.  Prior to Admission medications   Medication Sig Start Date End Date Taking? Authorizing Provider  sulfamethoxazole-trimethoprim (BACTRIM DS) 800-160 MG tablet Take 1 tablet by mouth 2 (two) times daily. 03/26/19   Laban Emperor, PA-C    Allergies Patient has no known allergies.  No family history on file.  Social History Social History   Tobacco Use  . Smoking status: Current Every Day Smoker  . Smokeless tobacco: Never Used  Substance Use Topics  . Alcohol use: Yes  . Drug use: Not Currently    Review of Systems Constitutional: No fever/chills Eyes: No visual changes. ENT: No sore throat. Cardiovascular: Denies chest pain. Respiratory: Denies shortness of breath. Gastrointestinal: Positive for lower abdominal pain, nausea. Genitourinary: Negative for dysuria. Musculoskeletal:  Negative for back pain. Skin: Negative for rash. Neurological: Negative for headaches, focal weakness or numbness.  ____________________________________________   PHYSICAL EXAM:  VITAL SIGNS: ED Triage Vitals [09/01/19 2112]  Enc Vitals Group     BP 119/85     Pulse Rate 70     Resp 17     Temp 98.1 F (36.7 C)     Temp src      SpO2 100 %     Weight 115 lb (52.2 kg)     Height 5' 3.5" (1.613 m)     Head Circumference      Peak Flow      Pain Score 10   Constitutional: Alert and oriented.  Eyes: Conjunctivae are normal.  ENT      Head: Normocephalic and atraumatic.      Nose: No congestion/rhinnorhea.      Mouth/Throat: Mucous membranes are moist.      Neck: No stridor. Hematological/Lymphatic/Immunilogical: No cervical lymphadenopathy. Cardiovascular: Normal rate, regular rhythm.  No murmurs, rubs, or gallops.  Respiratory: Normal respiratory effort without tachypnea nor retractions. Breath sounds are clear and equal bilaterally. No wheezes/rales/rhonchi. Gastrointestinal: Soft and tender in the suprapubic region.  Genitourinary: Deferred Musculoskeletal: Normal range of motion in all extremities. No lower extremity edema. Neurologic:  Normal speech and language. No gross focal neurologic deficits are appreciated.  Skin:  Skin is warm, dry and intact. No rash noted. Psychiatric: Mood and affect are normal. Speech and behavior are normal. Patient exhibits appropriate insight and judgment.  ____________________________________________    LABS (pertinent positives/negatives)  CMP wnl except anion gap  4 CBC wbc 5.9, hgb 11.7, plt 173 Lipase 35 Upreg negative UA clear, moderate hgb dipstick, 0-5 rbc and wbc ____________________________________________   EKG  None  ____________________________________________    RADIOLOGY  [place]  pending  ____________________________________________   PROCEDURES  Procedures  ____________________________________________   INITIAL IMPRESSION / ASSESSMENT AND PLAN / ED COURSE  Pertinent labs & imaging results that were available during my care of the patient were reviewed by me and considered in my medical decision making (see chart for details).   Patient presented to the emergency department today because of concerns for lower abdominal pain. Patient states she has history of ovarian cyst. She is slightly tender in the suprapubic region. UA without concerning signs for infection. Will obtain ultrasound to evaluate for ovarian pathology. Did offer to test patient for STDs however patient declined.  ___________________________________________   FINAL CLINICAL IMPRESSION(S) / ED DIAGNOSES  Final diagnoses:  Pelvic pain     Note: This dictation was prepared with Dragon dictation. Any transcriptional errors that result from this process are unintentional     Phineas Semen, MD 09/01/19 [phone]

## 2019-09-01 NOTE — ED Notes (Signed)
Pt to US.

## 2019-09-01 NOTE — ED Triage Notes (Signed)
Patient c/o lower abdominal pain and nausea. Patient denies diarrhea and urinary changes.

## 2019-09-02 MED ORDER — ACETAMINOPHEN 325 MG PO TABS
650.0000 mg | ORAL_TABLET | Freq: Once | ORAL | Status: AC
  Administered 2019-09-02: 650 mg via ORAL
  Filled 2019-09-02: qty 2

## 2019-09-02 NOTE — ED Notes (Signed)
Dr. V at bedside.

## 2019-09-02 NOTE — ED Provider Notes (Signed)
I was asked by Dr. Derrill Kay to follow-up the results of the transvaginal ultrasound which showed a hemorrhagic cyst.  Repeat abdominal exam with minimal tenderness.  Patient stable.  Discussed this finding with patient and recommended follow-up with OB/GYN.  Discussed signs of ovarian torsion and acute blood loss anemia and recommended return if these develop.   I have personally reviewed the images performed during this visit and I agree with the Radiologist's read.   Interpretation by Radiologist:  US PELVIC COMPLETE WITH TRANSVAGINAL  Result Date: 09/02/2019 CLINICAL DATA:  Pelvic pain EXAM: TRANSABDOMINAL AND TRANSVAGINAL ULTRASOUND OF PELVIS TECHNIQUE: Both transabdominal and transvaginal ultrasound examinations of the pelvis were performed. Transabdominal technique was performed for global imaging of the pelvis including uterus, ovaries, adnexal regions, and pelvic cul-de-sac. It was necessary to proceed with endovaginal exam following the transabdominal exam to visualize the pelvic viscera. COMPARISON:  09/20/2018 FINDINGS: Uterus Measurements: 6.0 x 3.9 by 4.5 cm = volume: 56 mL. There is a 0.7 x 0.6 x 0.7 cm submucosal fibroid toward the uterine fundus. No other uterine abnormalities. Endometrium Thickness: 3 mm.  No focal abnormality visualized. Right ovary Measurements: 3.2 x 2.9 x 2.8 cm = volume: 13.5 mL. There is a complex 2.9 x 1.9 by 1.2 cm cyst with fluid-fluid level. There is no internal vascularity or nodularity. Left ovary Measurements: 3.0 x 1.6 by 2.3 cm = volume: 5.9 mL. Normal appearance/no adnexal mass. Other findings No abnormal free fluid. IMPRESSION: 1. Complex right adnexal cyst, likely a hemorrhagic cyst or endometrioma. Overall no significant change in size since prior CT 09/20/2018. 2. Subcentimeter submucosal fibroid within the uterine fundus. 3. Otherwise unremarkable exam. Electronically Signed   By: Sharlet Salina M.D.   On: 09/02/2019 00:04      Nita Sickle,  MD 09/02/19 [phone]

## 2019-09-04 LAB — POCT PREGNANCY, URINE: Preg Test, Ur: NEGATIVE

## 2020-11-16 IMAGING — CT CT ABDOMEN AND PELVIS WITH CONTRAST
2 of 4 series · 16 of 46 positions shown, 18 images · IV contrast (omnipaque)
Comparison: None.

CLINICAL DATA: Right lower quadrant pain for 1 day

EXAM:
CT ABDOMEN AND PELVIS WITH CONTRAST
TECHNIQUE: Multidetector CT imaging of the abdomen and pelvis was performed
using the standard protocol following bolus administration of
intravenous contrast.
CONTRAST:  100mL OMNIPAQUE IOHEXOL 300 MG/ML  SOLN

[Series 3: abdomen 5.0 · axial · 0.64mm/px · z∈[+825,+1230]mm · 13 of 91 slices shown, 15 images]
[im 5/91  soft-tissue]
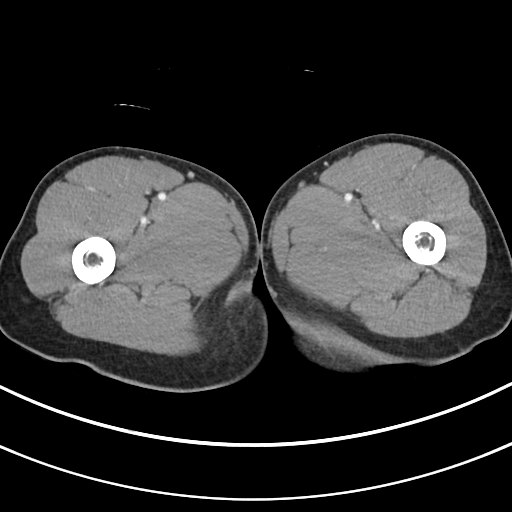
[im 5/91  bone]
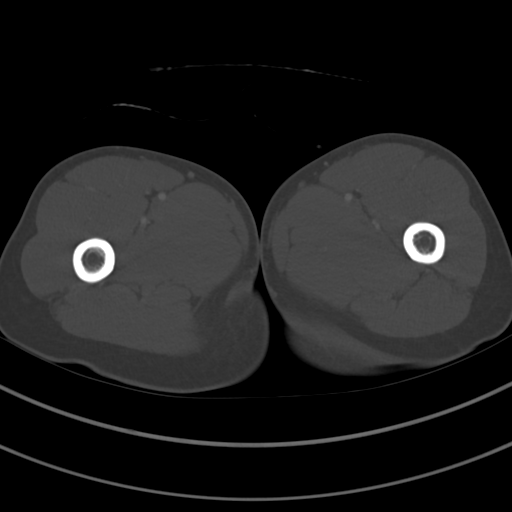
[im 15/91  soft-tissue]
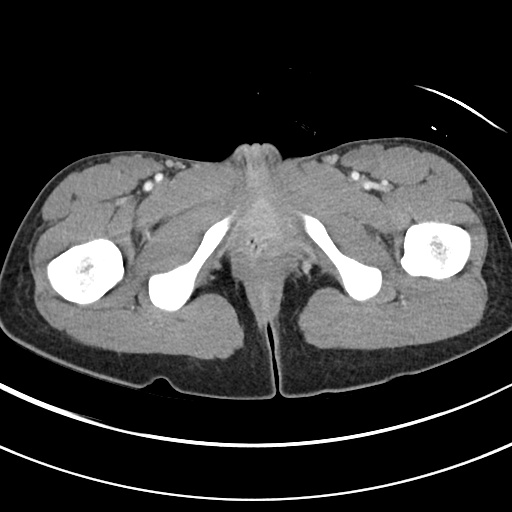
[im 19/91  soft-tissue]
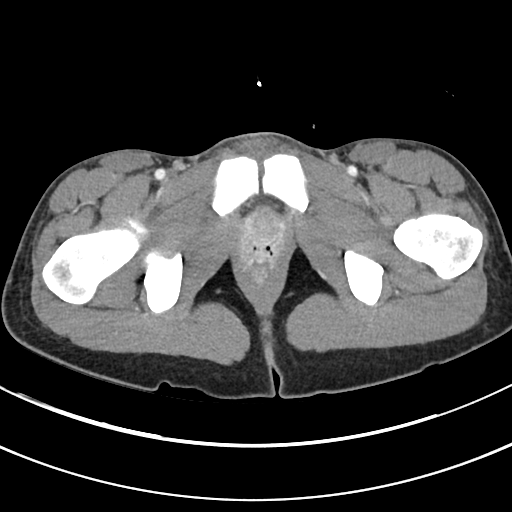
[im 24/91  soft-tissue]
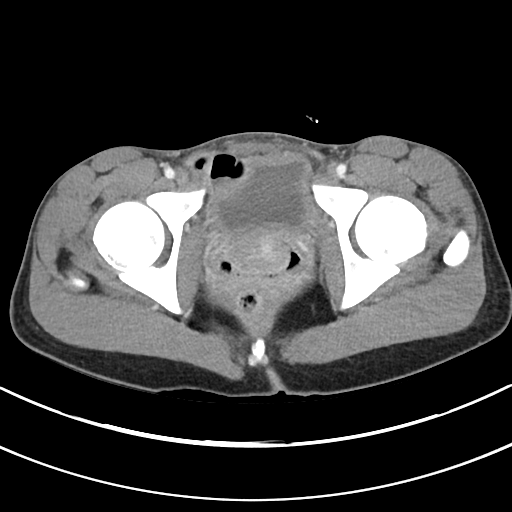
[im 34/91  soft-tissue]
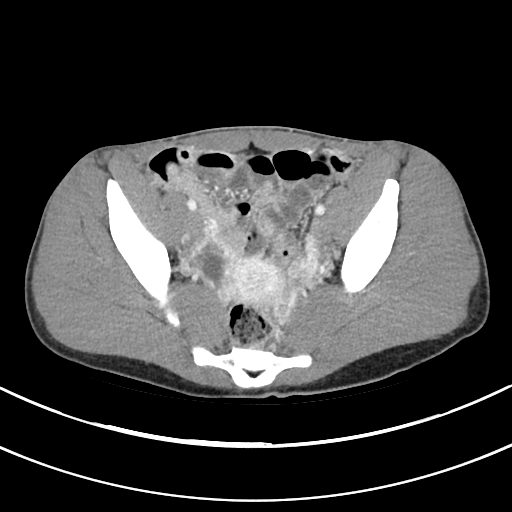
[im 38/91  soft-tissue]
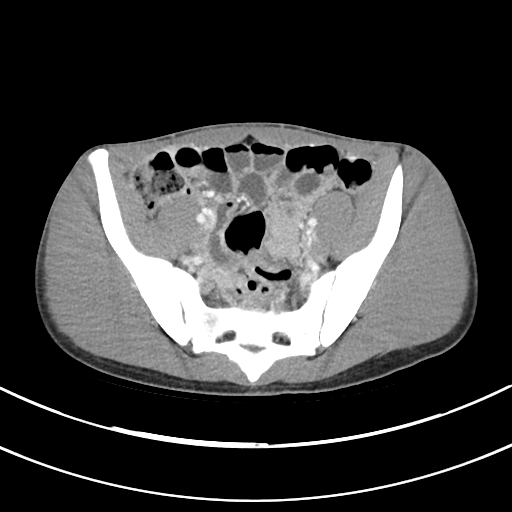
[im 48/91  soft-tissue]
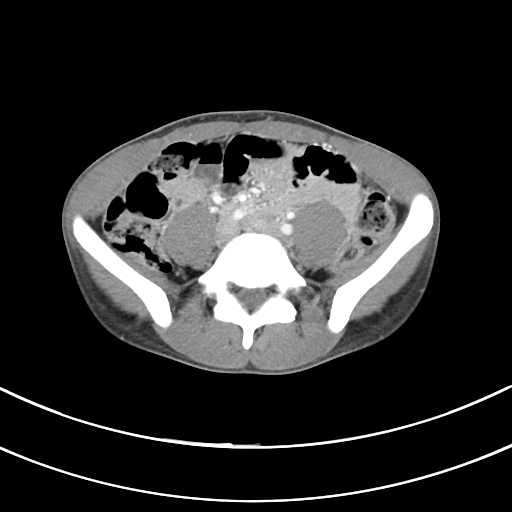
[im 53/91  soft-tissue]
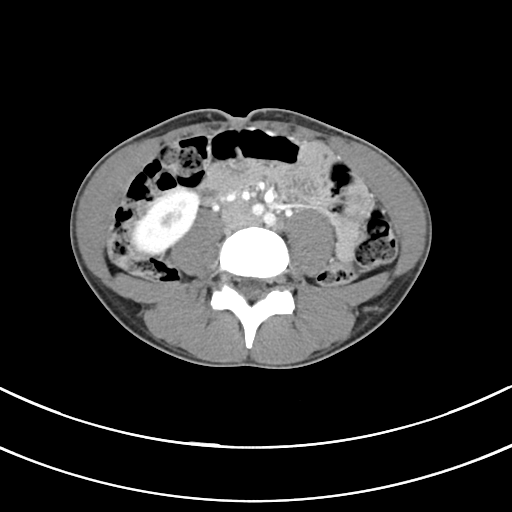
[im 57/91  soft-tissue]
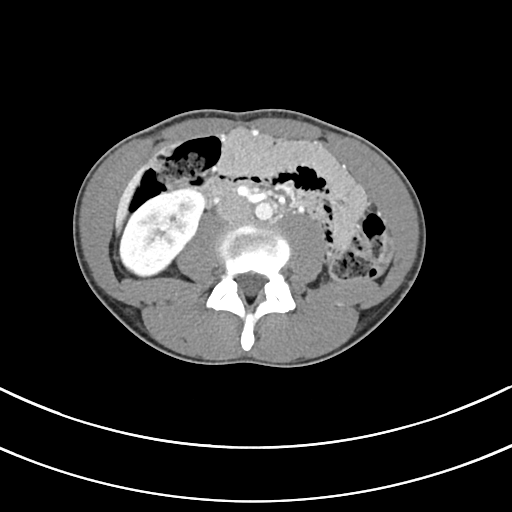
[im 57/91  bone]
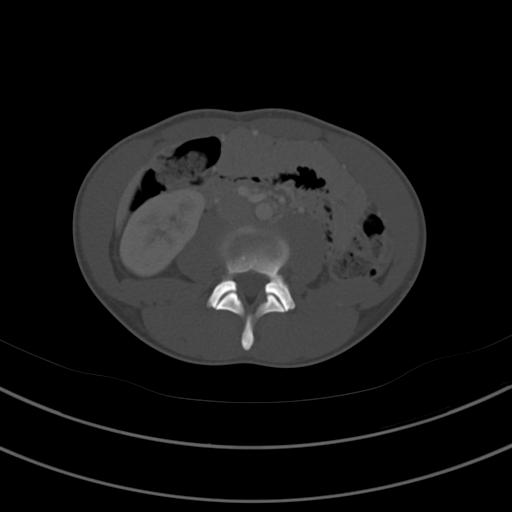
[im 67/91  soft-tissue]
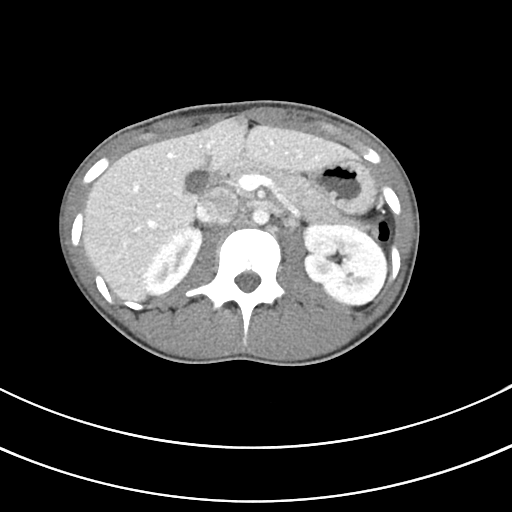
[im 72/91  soft-tissue]
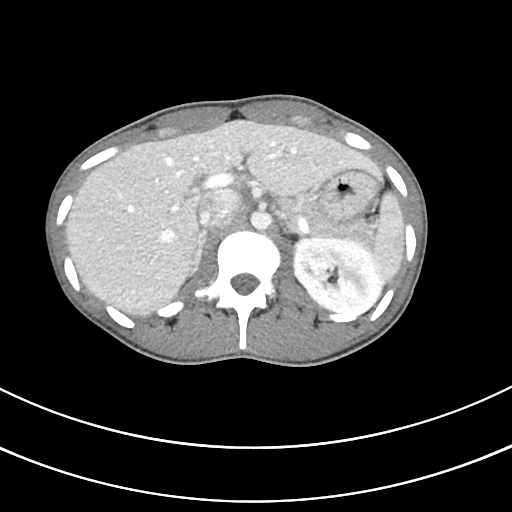
[im 76/91  soft-tissue]
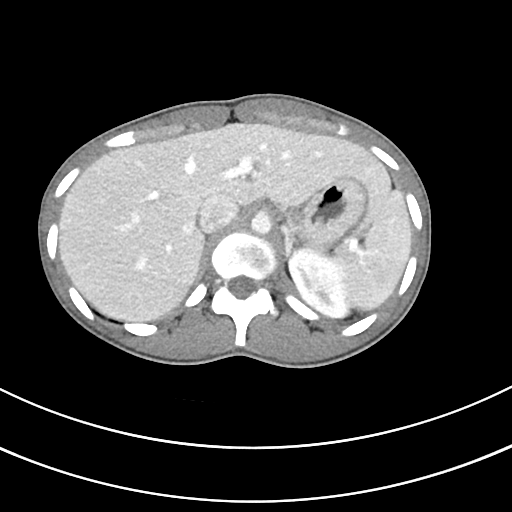
[im 86/91  soft-tissue]
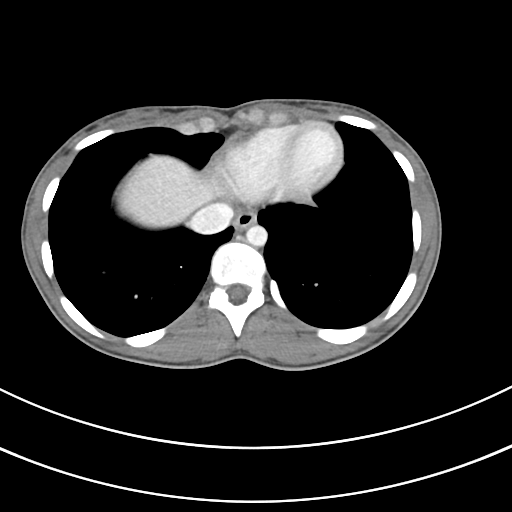

[Series 6: abdomen 3.0 mpr cor · coronal · 0.70mm/px · 3 of 74 slices shown]
[im 25/74  soft-tissue]
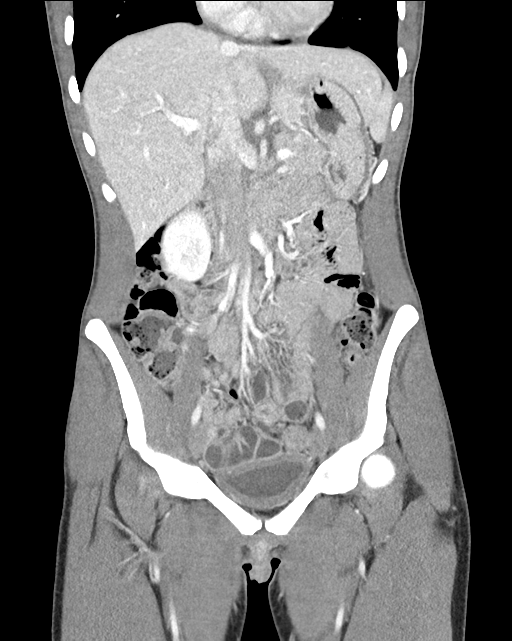
[im 33/74  soft-tissue]
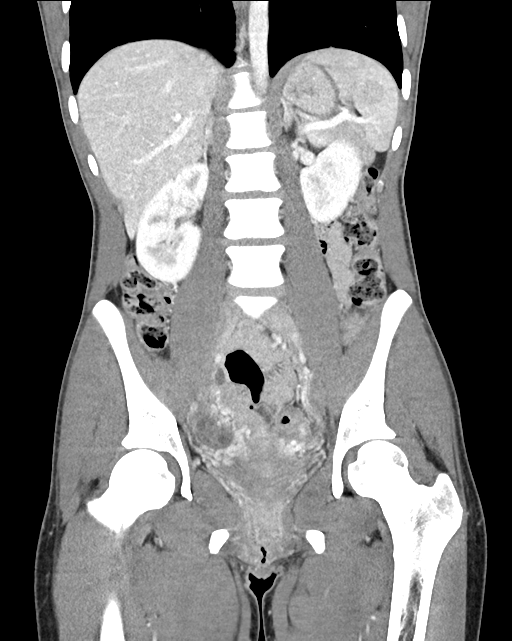
[im 41/74  soft-tissue]
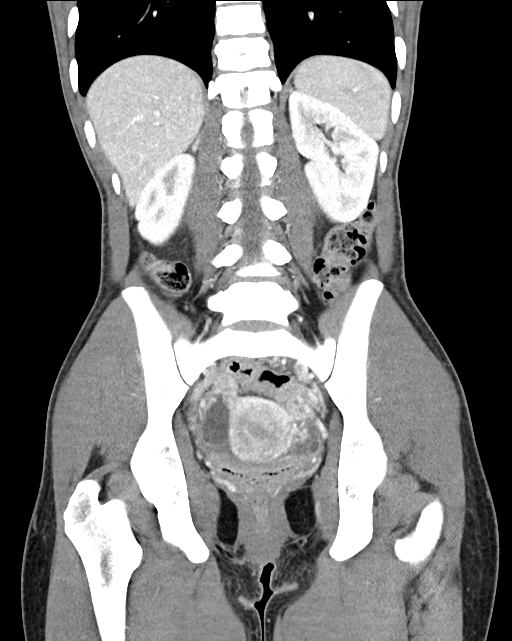

[16 of 46 positions shown; findings below may reference images not displayed]

FINDINGS: Lower chest: No acute abnormality.

Hepatobiliary: No focal liver abnormality is seen. No gallstones,
gallbladder wall thickening, or biliary dilatation.

Pancreas: Unremarkable. No pancreatic ductal dilatation or
surrounding inflammatory changes.

Spleen: Normal in size without focal abnormality.

Adrenals/Urinary Tract: Adrenal glands are normal. Kidneys show no
hydronephrosis. Thick-walled appearance of the urinary bladder

Stomach/Bowel: Stomach is within normal limits. Appendix appears
normal. No evidence of bowel wall thickening, distention, or
inflammatory changes.

Vascular/Lymphatic: Nonaneurysmal aorta. No significantly enlarged
lymph nodes

Reproductive: Uterus is unremarkable. 3 cm right adnexal cyst.
Bilateral somewhat tubular appearing structures paralleling the
right aspect of the uterus, possibly representing dilated slightly
enhancing fallopian tubes.

Other: No free air.  Slight loculated pelvic effusion on the right.

Musculoskeletal: No acute or significant osseous findings.
IMPRESSION: 1. Negative for acute appendicitis.
2. Thick-walled appearance of the urinary bladder, question cystitis
3. 3 cm right adnexal cyst.
4. Slight peripherally enhancing tubular structures paralleling the
uterus bilaterally, possibly representing slightly dilated fallopian
tubes with inflammatory change as could be seen with PID. Small
amount of loculated fluid in the right posterior pelvis but without
strong suggestion of a pelvic abscess.

## 2021-08-04 ENCOUNTER — Emergency Department
Admission: EM | Admit: 2021-08-04 | Discharge: 2021-08-04 | Disposition: A | Discharge status: Home / Self Care | Payer: Medicaid Other | Attending: Emergency Medicine | Admitting: Emergency Medicine

## 2021-08-04 ENCOUNTER — Other Ambulatory Visit: Payer: Self-pay

## 2021-08-04 DIAGNOSIS — H1032 Unspecified acute conjunctivitis, left eye: Secondary | ICD-10-CM | POA: Diagnosis not present

## 2021-08-04 DIAGNOSIS — H60501 Unspecified acute noninfective otitis externa, right ear: Secondary | ICD-10-CM

## 2021-08-04 DIAGNOSIS — H6091 Unspecified otitis externa, right ear: Secondary | ICD-10-CM | POA: Insufficient documentation

## 2021-08-04 DIAGNOSIS — N760 Acute vaginitis: Secondary | ICD-10-CM | POA: Insufficient documentation

## 2021-08-04 DIAGNOSIS — B9689 Other specified bacterial agents as the cause of diseases classified elsewhere: Secondary | ICD-10-CM | POA: Insufficient documentation

## 2021-08-04 DIAGNOSIS — N94818 Other vulvodynia: Secondary | ICD-10-CM | POA: Insufficient documentation

## 2021-08-04 DIAGNOSIS — H9201 Otalgia, right ear: Secondary | ICD-10-CM | POA: Diagnosis present

## 2021-08-04 LAB — WET PREP, GENITAL
Sperm: NONE SEEN
Trich, Wet Prep: NONE SEEN
WBC, Wet Prep HPF POC: <10 (ref <10)
Yeast Wet Prep HPF POC: NONE SEEN

## 2021-08-04 LAB — URINALYSIS, ROUTINE W REFLEX MICROSCOPIC
Bacteria, UA: NONE SEEN
Bilirubin Urine: NEGATIVE
Glucose, UA: NEGATIVE mg/dL
Hgb urine dipstick: NEGATIVE
Ketones, ur: NEGATIVE mg/dL
Nitrite: NEGATIVE
Protein, ur: NEGATIVE mg/dL
Specific Gravity, Urine: 1.019 (ref 1.005–1.030)
pH: 6 (ref 5.0–8.0)

## 2021-08-04 LAB — POC URINE PREG, ED: Preg Test, Ur: NEGATIVE

## 2021-08-04 LAB — CHLAMYDIA/NGC RT PCR (ARMC ONLY)
Chlamydia Tr: NOT DETECTED
N gonorrhoeae: NOT DETECTED

## 2021-08-04 MED ORDER — CIPROFLOXACIN-DEXAMETHASONE 0.3-0.1 % OT SUSP: 4.0000 [drp] | Freq: Two times a day (BID) | OTIC | 0 refills | Status: AC

## 2021-08-04 MED ORDER — METRONIDAZOLE 500 MG PO TABS: 500.0000 mg | ORAL_TABLET | Freq: Two times a day (BID) | ORAL | 0 refills | Status: DC

## 2021-08-04 MED ORDER — POLYMYXIN B-TRIMETHOPRIM 10000-0.1 UNIT/ML-% OP SOLN: OPHTHALMIC | 0 refills | Status: DC

## 2021-08-04 MED ORDER — FOSFOMYCIN TROMETHAMINE 3 G PO PACK
3.0000 g | PACK | Freq: Once | ORAL | Status: AC
  Administered 2021-08-04: 3 g via ORAL
  Filled 2021-08-04: qty 3

## 2021-08-04 NOTE — Discharge Instructions (Addendum)
As we discussed, you most likely have a viral infection that is leading to the symptoms in your ear and URI.  However, it is important to use the prescriptions for both the eardrops and eyedrops to try and avoid a bacterial infection and treat any that is already present.  Do not get any water or any other liquids into your ear until it is completely healed and no longer bothering you.  Please follow-up in MyChart to look for the results of your chlamydia/gonorrhea test to make sure that they are both negative.  If they are positive, you can follow-up at the [facility] department for treatment.  Please take the full course of metronidazole (Flagyl) as prescribed for your bacterial vaginosis, which is a very common and not to sexually transmitted infection.  Consider establishing a primary care provider at [facility].  Return to the emergency department if you develop new or worsening symptoms that concern you.

## 2021-08-04 NOTE — ED Provider Notes (Signed)
[facility] Provider Note    Event Date/Time   First MD Initiated Contact with Patient 08/04/21 [id number]     (approximate)   History   Ear Pain   HPI  Amber Hendrix is a 26 y.o. female who reports no chronic medical conditions and presents for evaluation of multiple complaints.  She reports that for the last 2 to 3 days she has had pain in the right side of her ear with decreased hearing and occasional drainage from the ear.  No recent trauma, no swimming.  She also reports that around the same time her right eye got red and itchy, but then the symptoms completely resolved in the right eye and moved to the left eye.  It remains red and itchy.  She is also felt a little bit of nasal congestion recently.  No sore throat.  She also reports that she wants to be checked out because she does not typically wear maxipads but she wore pads for the last week while she was on her menstrual cycle and now she has pain "in that area".  She states that it burns when she urinates and that it hurts to touch "down at the bottom on the outside".  No discharge.  The patient is not concerned about STDs although she admitted it is possible she could have one.     Physical Exam   Triage Vital Signs: ED Triage Vitals  Enc Vitals Group     BP 08/04/21 0452 124/75     Pulse Rate 08/04/21 0452 97     Resp 08/04/21 0452 16     Temp 08/04/21 0452 98.8 F (37.1 C)     Temp Source 08/04/21 0452 Oral     SpO2 08/04/21 0452 100 %     Weight 08/04/21 0453 49.9 kg (110 lb)     Height 08/04/21 0453 1.6 m (5\' 3" )     Head Circumference --      Peak Flow --      Pain Score 08/04/21 0453 10     Pain Loc --      Pain Edu? --      Excl. in [place]? --     Most recent vital signs: Vitals:   08/04/21 0608 08/04/21 0714  BP:  110/63  Pulse:  91  Resp:  16  Temp:    SpO2: 97% 98%     General: Awake, no distress.  Eyes:  Normal-appearing right eye.  Left eye has some conjunctival  injection.  Pupils are equal and reactive bilaterally.  No foreign body present. Ears:  Normal-appearing left ear canal and tympanic membrane.  Right ear canal is erythematous with small amount of debris.  Tympanic membrane appears intact. CV:  Good peripheral perfusion.  Resp:  Normal effort.  Abd:  No distention.  No tenderness to palpation. GU:  Normal-appearing external female genital exam with no lesions, herpetic/vesicular or otherwise.  No swelling, no tenderness palpation of the labia.  Speculum exam is generally unremarkable with an appropriate amount of whitish discharge and no evidence of cervicitis.  No vaginal skin lesions are present.  No cervical motion tenderness on bimanual exam.  ED nurse present as chaperone throughout examination.   ED Results / Procedures / Treatments   Labs (all labs ordered are listed, but only abnormal results are displayed) Labs Reviewed  WET PREP, GENITAL - Abnormal; Notable for the following components:      Result Value   Clue Cells  Wet Prep HPF POC PRESENT (*)    All other components within normal limits  URINALYSIS, ROUTINE W REFLEX MICROSCOPIC - Abnormal; Notable for the following components:   Color, Urine YELLOW (*)    APPearance HAZY (*)    Leukocytes,Ua SMALL (*)    All other components within normal limits  CHLAMYDIA/NGC RT PCR ([facility] ONLY)            POC URINE PREG, ED      PROCEDURES:  Critical Care performed: No  Procedures   MEDICATIONS ORDERED IN ED: Medications  fosfomycin (MONUROL) packet 3 g (3 g Oral Given 08/04/21 0714)     IMPRESSION / MDM / ASSESSMENT AND PLAN / ED COURSE  I reviewed the triage vital signs and the nursing notes.                              Differential diagnosis includes, but is not limited to, viral infection, otitis externa, otitis media, perforated eardrum, multiple potential types of conjunctivitis (bacterial, viral, allergic).  GU symptoms could be the result of contact dermatitis, UTI,  STD/PID, BV.  Patient's presentation is most consistent with acute, uncomplicated illness.  Upper respiratory symptoms are most likely viral, but the patient is reporting some discharge from her right ear and there is what appears to be some debris and/or evidence of trauma.  She admitted that she picks at her ear after I asked her about it.  Given the possibility of a perforated tympanic membrane and effusion, I am treating with Ciprodex prescription and I had my usual and customary effusion and perforation discussion with the patient including follow-up with ENT.  I also provided a prescription for Polytrim drops for the possibility of conjunctivitis.  Pelvic exam was reassuring.  I ordered urinalysis, urine pregnancy test, wet prep, and gonorrhea/chlamydia test.    Clinical Course as of 08/04/21 0738  Mon Aug 04, 2021  0865 Preg Test, Ur: Negative [CF]  [id number] Urinalysis, Routine w reflex microscopic(!) There are a few WBCs, no bacteria, no RBCs.  Doubt UTI, but given her symptoms I will treat with a one-time dose of fosfomycin 3 g p.o. [CF]  9629 Wet prep is negative for yeast and trichomoniasis, positive for clue cells will treat with metronidazole for BV.  I updated the patient.  She does not want to wait for the gonorrhea and Chlamydia tests which I think is reasonable because I have very low suspicion.  She will check in MyChart for the results.  I also explained about the dose of fosfomycin and the need for prescription for metronidazole as well.  She will follow-up as needed as an outpatient and I gave my usual and customary return precautions. [CF]    Clinical Course User Index [CF] Amber Rose, MD     FINAL CLINICAL IMPRESSION(S) / ED DIAGNOSES   Final diagnoses:  Vulvar discomfort  Acute otitis externa of right ear, unspecified type  Acute conjunctivitis of left eye, unspecified acute conjunctivitis type  Bacterial vaginosis     Rx / DC Orders   ED Discharge Orders           Ordered    ciprofloxacin-dexamethasone (CIPRODEX) OTIC suspension  2 times daily        08/04/21 0639    trimethoprim-polymyxin b (POLYTRIM) ophthalmic solution        08/04/21 0639    metroNIDAZOLE (FLAGYL) 500 MG tablet  2 times daily  08/04/21 1308             Note:  This document was prepared using Dragon voice recognition software and may include unintentional dictation errors.   Amber Rose, MD 08/04/21 [phone]

## 2021-08-04 NOTE — ED Triage Notes (Signed)
Pt complains of right sided ear pain for two days. Pt appears in no acute distress.

## 2021-08-04 NOTE — ED Notes (Signed)
Pt states that her vaginal area is "irritated."  Right ear "feels like drainage."  Endorse pain of 8 in ear, describes as "stuffy."

## 2021-10-23 ENCOUNTER — Ambulatory Visit
Admission: EM | Admit: 2021-10-23 | Discharge: 2021-10-23 | Disposition: A | Discharge status: Home / Self Care | Payer: Medicaid Other | Attending: Emergency Medicine | Admitting: Emergency Medicine

## 2021-10-23 DIAGNOSIS — N898 Other specified noninflammatory disorders of vagina: Secondary | ICD-10-CM | POA: Diagnosis not present

## 2021-10-23 LAB — POCT URINALYSIS DIP (MANUAL ENTRY)
Blood, UA: NEGATIVE
Glucose, UA: NEGATIVE mg/dL
Leukocytes, UA: NEGATIVE
Nitrite, UA: NEGATIVE
Protein Ur, POC: 100 mg/dL — AB
Spec Grav, UA: >=1.03 — AB (ref 1.010–1.025)
Urobilinogen, UA: 1 E.U./dL
pH, UA: 5.5 (ref 5.0–8.0)

## 2021-10-23 LAB — POCT URINE PREGNANCY: Preg Test, Ur: NEGATIVE

## 2021-10-23 NOTE — ED Triage Notes (Signed)
Patient to UC with complaints of vaginal itching and white discharge x2 days. Reports foul odor. States she recently started using a new soap and believes this could be the reason. Denies urinary symptoms. No concern for STD.

## 2021-10-23 NOTE — ED Provider Notes (Signed)
Renaldo Fiddler    CSN: [id number] Arrival date & time: 10/23/21  2671      History   Chief Complaint Chief Complaint  Patient presents with   Vaginal Discharge    HPI Lama Narayanan is a 26 y.o. female.  Patient presents with malodorous white vaginal discharge and itching x2 days.  She attributes this to using a new brand of soap.  She denies fever, chills, abdominal pain, flank pain, dysuria, hematuria, pelvic pain, rash, or other symptoms.  No treatments at home.  The history is provided by the patient and medical records.    Past Medical History:  Diagnosis Date   Ovarian cyst     There are no problems to display for this patient.   History reviewed. No pertinent surgical history.  OB History   No obstetric history on file.      Home Medications    Prior to Admission medications   Medication Sig Start Date End Date Taking? Authorizing Provider  metroNIDAZOLE (FLAGYL) 500 MG tablet Take 1 tablet (500 mg total) by mouth 2 (two) times daily. 08/04/21   [name], MD  sulfamethoxazole-trimethoprim (BACTRIM DS) 800-160 MG tablet Take 1 tablet by mouth 2 (two) times daily. Patient not taking: Reported on 08/04/2021 03/26/19   Enid Derry, PA-C  trimethoprim-polymyxin b (POLYTRIM) ophthalmic solution Put 1 drop in affected eye(s) every 4 hours while awake for 1 week 08/04/21   [name], MD    Family History History reviewed. No pertinent family history.  Social History Social History   Tobacco Use   Smoking status: Every Day   Smokeless tobacco: Never  Substance Use Topics   Alcohol use: Yes   Drug use: Not Currently     Allergies   Patient has no known allergies.   Review of Systems Review of Systems  Constitutional:  Negative for chills and fever.  Gastrointestinal:  Negative for abdominal pain, diarrhea and vomiting.  Genitourinary:  Positive for vaginal discharge. Negative for dysuria, flank pain, hematuria and pelvic pain.   Skin:  Negative for rash.  All other systems reviewed and are negative.    Physical Exam Triage Vital Signs ED Triage Vitals [10/23/21 0842]  Enc Vitals Group     BP      Pulse      Resp      Temp      Temp src      SpO2      Weight 115 lb (52.2 kg)     Height 5' 3.5" (1.613 m)     Head Circumference      Peak Flow      Pain Score 0     Pain Loc      Pain Edu?      Excl. in GC?    No data found.  Updated Vital Signs BP 117/74   Pulse 80   Temp (!) 97.3 F (36.3 C) (Temporal)   Resp 18   Ht 5' 3.5" (1.613 m)   Wt 115 lb (52.2 kg)   LMP 10/16/2021   SpO2 98%   BMI 20.05 kg/m   Visual Acuity Right Eye Distance:   Left Eye Distance:   Bilateral Distance:    Right Eye Near:   Left Eye Near:    Bilateral Near:     Physical Exam Vitals and nursing note reviewed.  Constitutional:      General: She is not in acute distress.    Appearance: Normal appearance. She is  well-developed. She is not ill-appearing.  HENT:     Mouth/Throat:     Mouth: Mucous membranes are moist.  Cardiovascular:     Rate and Rhythm: Normal rate and regular rhythm.     Heart sounds: Normal heart sounds.  Pulmonary:     Effort: Pulmonary effort is normal. No respiratory distress.     Breath sounds: Normal breath sounds.  Abdominal:     General: Bowel sounds are normal.     Palpations: Abdomen is soft.     Tenderness: There is no abdominal tenderness. There is no right CVA tenderness, left CVA tenderness, guarding or rebound.  Musculoskeletal:     Cervical back: Neck supple.  Skin:    General: Skin is warm and dry.  Neurological:     Mental Status: She is alert.  Psychiatric:        Mood and Affect: Mood normal.        Behavior: Behavior normal.      UC Treatments / Results  Labs (all labs ordered are listed, but only abnormal results are displayed) Labs Reviewed  POCT URINALYSIS DIP (MANUAL ENTRY) - Abnormal; Notable for the following components:      Result Value    Clarity, UA cloudy (*)    Bilirubin, UA small (*)    Ketones, POC UA small (15) (*)    Spec Grav, UA >=1.030 (*)    Protein Ur, POC =100 (*)    All other components within normal limits  POCT URINE PREGNANCY  CERVICOVAGINAL ANCILLARY ONLY    EKG   Radiology No results found.  Procedures Procedures (including critical care time)  Medications Ordered in UC Medications - No data to display  Initial Impression / Assessment and Plan / UC Course  I have reviewed the triage vital signs and the nursing notes.  Pertinent labs & imaging results that were available during my care of the patient were reviewed by me and considered in my medical decision making (see chart for details).    Vaginal discharge.  Urine does not show signs of infection.  Urine pregnancy negative.  Patient obtained self swab for testing.  Discussed that we will call if test results are positive.  Discussed that she may require treatment at that time.  Instructed patient to abstain from sexual activity for at least 7 days.  Instructed her to follow-up with her PCP or gynecologist if her symptoms are not improving.  Patient agrees to plan of care.   Final Clinical Impressions(s) / UC Diagnoses   Final diagnoses:  Vaginal discharge     Discharge Instructions      Your vaginal tests are pending.  If your test results are positive, we will call you.  You and your sexual partner(s) may require treatment at that time.  Do not have sexual activity for at least 7 days.    Follow up with your gynecologist or primary care provider if your symptoms are not improving.        ED Prescriptions   None    PDMP not reviewed this encounter.   Mickie Bail, NP 10/23/21 [phone]

## 2021-10-23 NOTE — Discharge Instructions (Addendum)
Your vaginal tests are pending.  If your test results are positive, we will call you.  You and your sexual partner(s) may require treatment at that time.  Do not have sexual activity for at least 7 days.    Follow up with your gynecologist or primary care provider if your symptoms are not improving.

## 2021-10-24 ENCOUNTER — Telehealth (HOSPITAL_COMMUNITY): Payer: Self-pay | Admitting: Emergency Medicine

## 2021-10-24 LAB — CERVICOVAGINAL ANCILLARY ONLY
Bacterial Vaginitis (gardnerella): POSITIVE — AB
Candida Glabrata: NEGATIVE
Candida Vaginitis: POSITIVE — AB
Chlamydia: NEGATIVE
Comment: NEGATIVE
Comment: NEGATIVE
Comment: NEGATIVE
Comment: NEGATIVE
Comment: NEGATIVE
Comment: NORMAL
Neisseria Gonorrhea: NEGATIVE
Trichomonas: POSITIVE — AB

## 2021-10-24 MED ORDER — METRONIDAZOLE 500 MG PO TABS: 500.0000 mg | ORAL_TABLET | Freq: Two times a day (BID) | ORAL | 0 refills | Status: DC

## 2021-10-24 MED ORDER — FLUCONAZOLE 150 MG PO TABS: 150.0000 mg | ORAL_TABLET | Freq: Once | ORAL | 0 refills | Status: AC

## 2021-10-28 IMAGING — US US PELVIS COMPLETE WITH TRANSVAGINAL
1 series · 13 of 25 positions shown · non-contrast
Comparison: 09/20/2018

CLINICAL DATA: Pelvic pain

EXAM:
TRANSABDOMINAL AND TRANSVAGINAL ULTRASOUND OF PELVIS
TECHNIQUE: Both transabdominal and transvaginal ultrasound examinations of the
pelvis were performed. Transabdominal technique was performed for
global imaging of the pelvis including uterus, ovaries, adnexal
regions, and pelvic cul-de-sac. It was necessary to proceed with
endovaginal exam following the transabdominal exam to visualize the
pelvic viscera.

[Series 1: gyn us · 87 acquisitions, 13 frames shown]
[im 1/87]
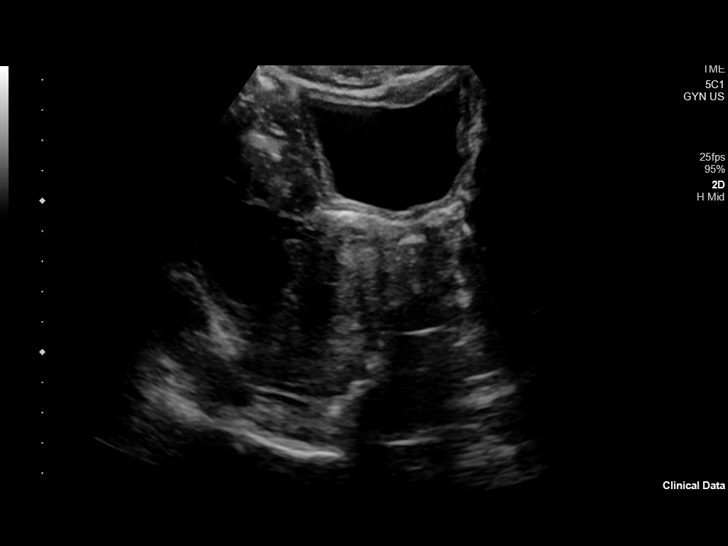
[im 8/87]
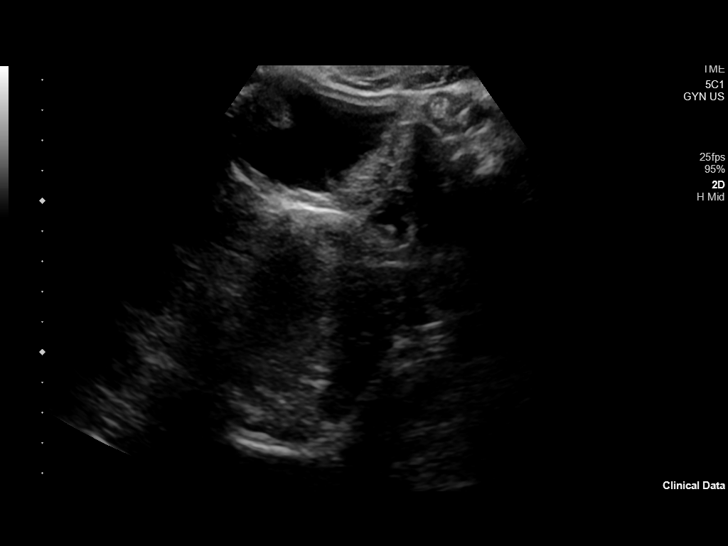
[im 15/87]
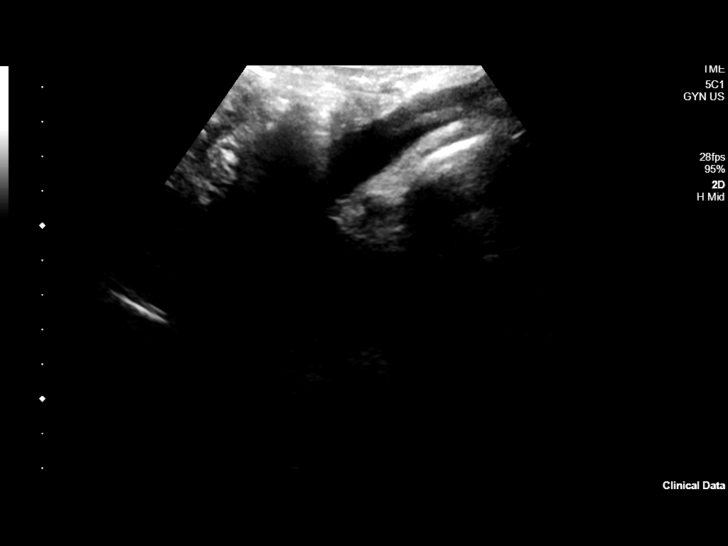
[im 22/87]
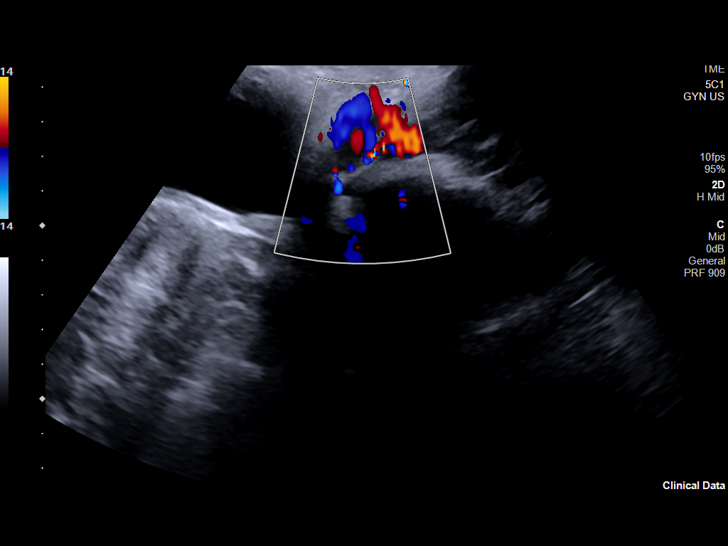
[im 29/87]
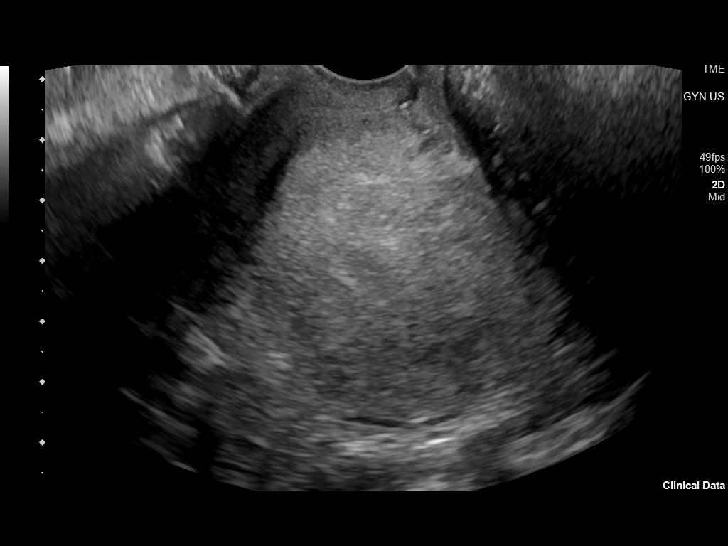
[im 36/87]
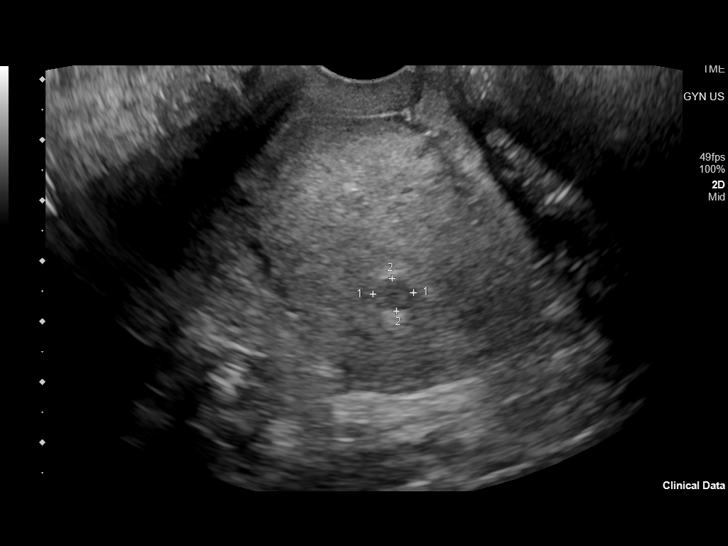
[im 44/87]
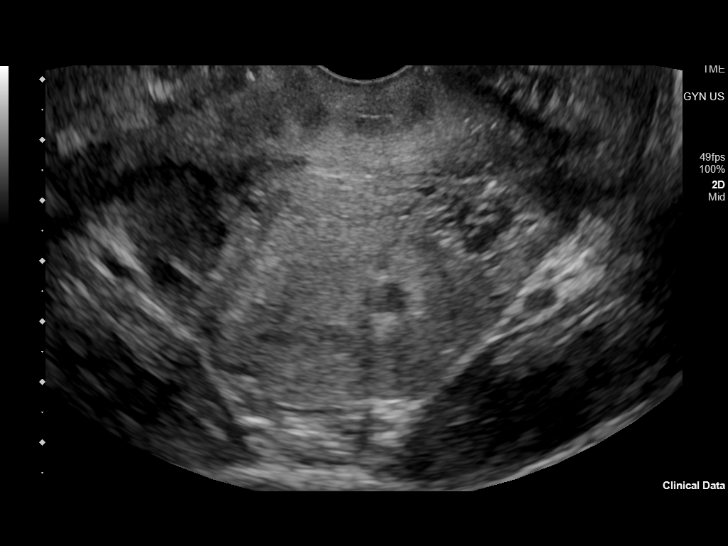
[im 51/87]
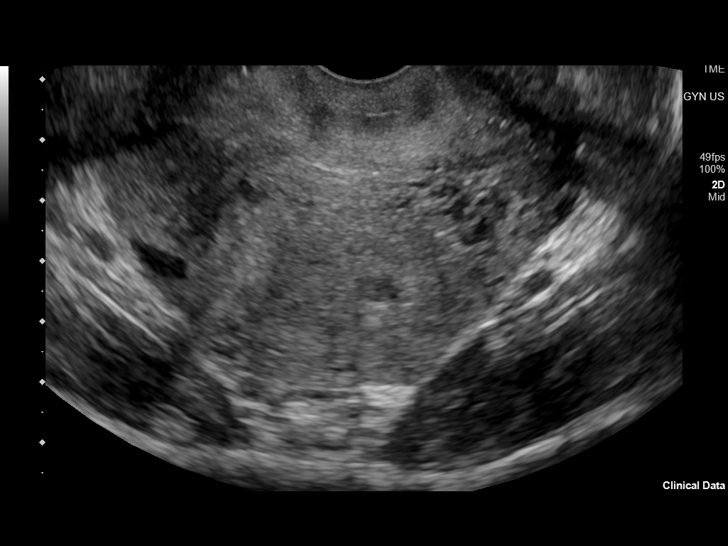
[im 58/87]
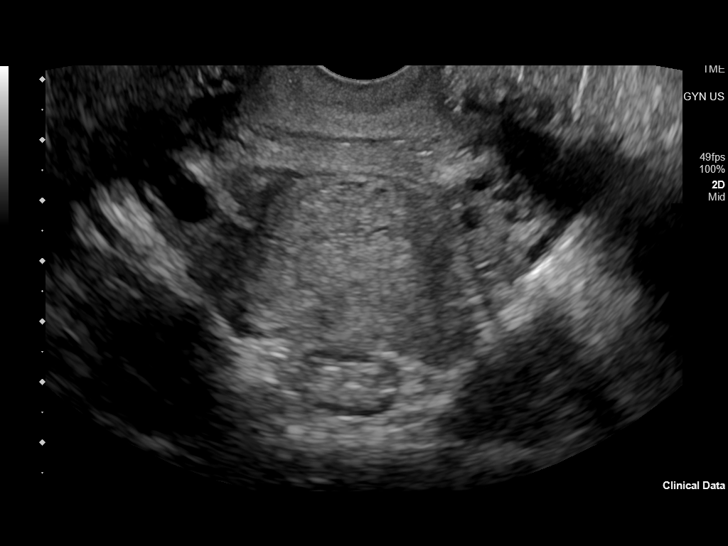
[im 65/87]
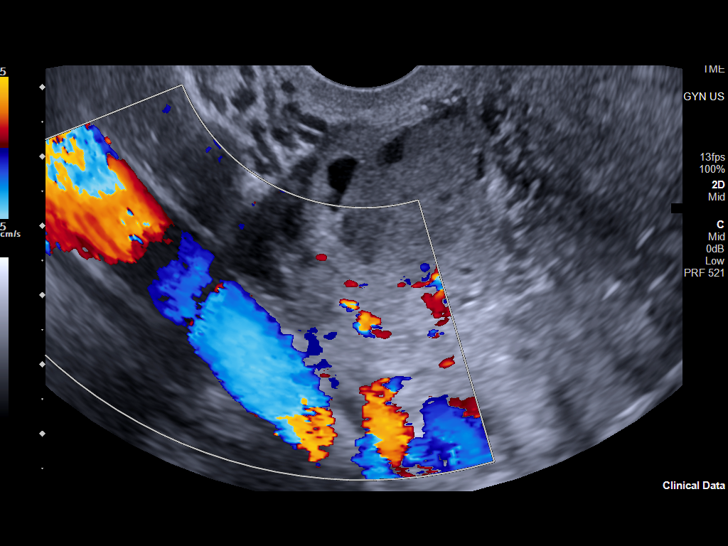
[im 72/87]
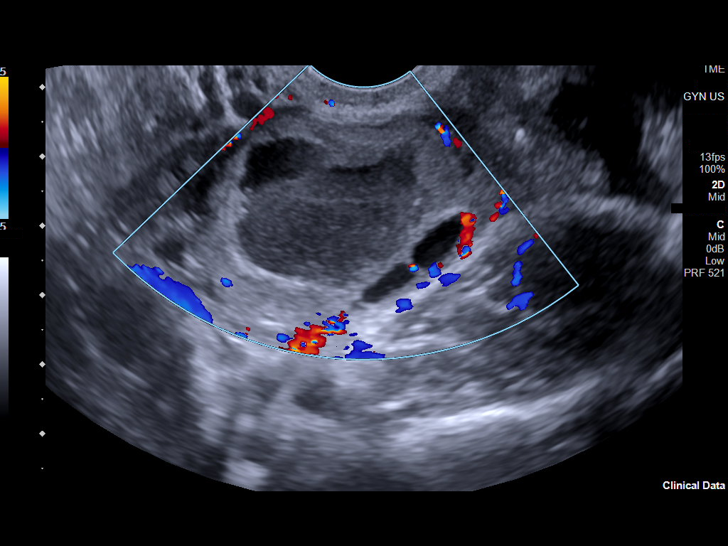
[im 79/87]
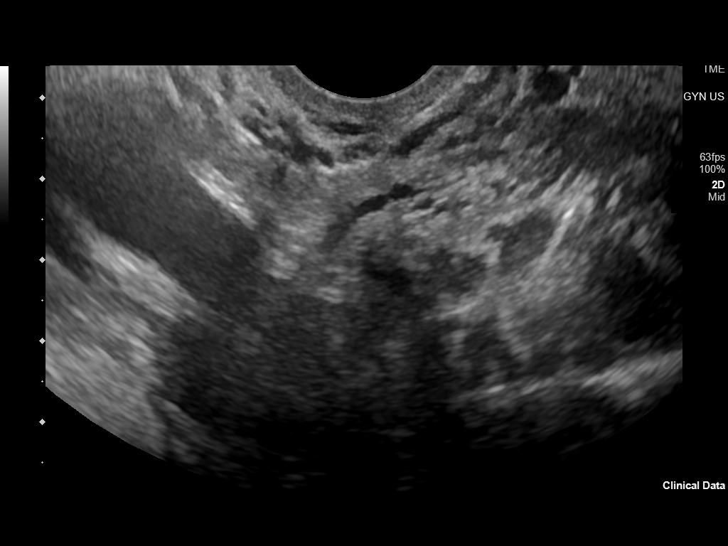
[im 87/87]
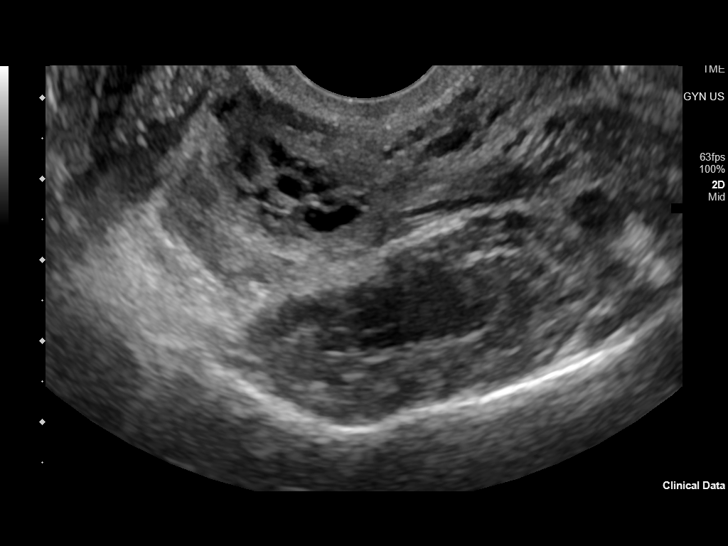

[13 of 25 positions shown; findings below may reference images not displayed]

FINDINGS: Uterus

Measurements: 6.0 x 3.9 by 4.5 cm = volume: 56 mL. There is a 0.7 x
0.6 x 0.7 cm submucosal fibroid toward the uterine fundus. No other
uterine abnormalities.

Endometrium

Thickness: 3 mm.  No focal abnormality visualized.

Right ovary

Measurements: 3.2 x 2.9 x 2.8 cm = volume: 13.5 mL. There is a
complex 2.9 x 1.9 by 1.2 cm cyst with fluid-fluid level. There is no
internal vascularity or nodularity.

Left ovary

Measurements: 3.0 x 1.6 by 2.3 cm = volume: 5.9 mL. Normal
appearance/no adnexal mass.

Other findings

No abnormal free fluid.
IMPRESSION: 1. Complex right adnexal cyst, likely a hemorrhagic cyst or
endometrioma. Overall no significant change in size since prior CT
09/20/2018.
[DATE]. Subcentimeter submucosal fibroid within the uterine fundus.
3. Otherwise unremarkable exam.

## 2022-02-05 ENCOUNTER — Encounter (HOSPITAL_COMMUNITY): Payer: Self-pay | Admitting: Emergency Medicine

## 2022-02-05 ENCOUNTER — Ambulatory Visit (HOSPITAL_COMMUNITY)
Admission: EM | Admit: 2022-02-05 | Discharge: 2022-02-05 | Disposition: A | Discharge status: Home / Self Care | Payer: Medicaid Other | Attending: Internal Medicine | Admitting: Internal Medicine

## 2022-02-05 DIAGNOSIS — N760 Acute vaginitis: Secondary | ICD-10-CM | POA: Insufficient documentation

## 2022-02-05 MED ORDER — METRONIDAZOLE 500 MG PO TABS: 500.0000 mg | ORAL_TABLET | Freq: Two times a day (BID) | ORAL | 0 refills | Status: AC

## 2022-02-05 NOTE — Discharge Instructions (Addendum)
Your STD testing has been sent to the lab and will come back in the next 2 to 3 days.  We will call you if any of your results are positive requiring treatment and treat you at that time.  As your symptoms are similar to previous BV infections we will go ahead and treat.  Avoid alcohol while taking Flagyl and take with food.  Avoid sexual intercourse until your STD results come back.  If any of your STD results are positive, you will need to avoid sexual intercourse for 7 days while you are being treated to prevent spread of STD.  Condom use is the best way to prevent spread of STDs.  Return to urgent care as needed.

## 2022-02-05 NOTE — ED Triage Notes (Signed)
Pt reports vaginal odor x 1 day. Denies other vaginal symptoms. Requesting STD testing.

## 2022-02-05 NOTE — ED Provider Notes (Signed)
[facility]    CSN: [id number] Arrival date & time: 02/05/22  1601      History   Chief Complaint Chief Complaint  Patient presents with   SEXUALLY TRANSMITTED DISEASE    HPI Amber Hendrix is a 26 y.o. female.  1 day history of abnormal vaginal discharge with fishy odor.  Patient reports similar to previous BV infections.  She denies any abdominal or pelvic pain, no unusual vaginal bleeding, no recent fever or chills.     Past Medical History:  Diagnosis Date   Ovarian cyst     There are no problems to display for this patient.   History reviewed. No pertinent surgical history.  OB History   No obstetric history on file.      Home Medications    Prior to Admission medications   Medication Sig Start Date End Date Taking? Authorizing Provider  metroNIDAZOLE (FLAGYL) 500 MG tablet Take 1 tablet (500 mg total) by mouth 2 (two) times daily for 7 days. 02/05/22 02/12/22 Yes Rolla Etienne, NP  sulfamethoxazole-trimethoprim (BACTRIM DS) 800-160 MG tablet Take 1 tablet by mouth 2 (two) times daily. Patient not taking: Reported on 08/04/2021 03/26/19   Enid Derry, PA-C  trimethoprim-polymyxin b (POLYTRIM) ophthalmic solution Put 1 drop in affected eye(s) every 4 hours while awake for 1 week 08/04/21   [name], MD    Family History History reviewed. No pertinent family history.  Social History Social History   Tobacco Use   Smoking status: Every Day   Smokeless tobacco: Never  Substance Use Topics   Alcohol use: Yes   Drug use: Not Currently     Allergies   Patient has no known allergies.   Review of Systems As stated in HPI otherwise negative  Physical Exam Triage Vital Signs ED Triage Vitals [02/05/22 1730]  Enc Vitals Group     BP 130/72     Pulse Rate 91     Resp 18     Temp 98.7 F (37.1 C)     Temp Source Oral     SpO2 99 %     Weight      Height      Head Circumference      Peak Flow      Pain Score 0     Pain  Loc      Pain Edu?      Excl. in GC?    No data found.  Updated Vital Signs BP 130/72 (BP Location: Right Arm)   Pulse 91   Temp 98.7 F (37.1 C) (Oral)   Resp 18   SpO2 99%   Visual Acuity Right Eye Distance:   Left Eye Distance:   Bilateral Distance:    Right Eye Near:   Left Eye Near:    Bilateral Near:     Physical Exam Constitutional:      General: She is not in acute distress.    Appearance: Normal appearance. She is normal weight. She is not ill-appearing or toxic-appearing.  Abdominal:     General: Bowel sounds are normal.     Palpations: Abdomen is soft.     Tenderness: There is no abdominal tenderness. There is no guarding or rebound.  Skin:    General: Skin is warm and dry.  Neurological:     General: No focal deficit present.     Mental Status: She is alert and oriented to person, place, and time.  Psychiatric:  Mood and Affect: Mood normal.        Behavior: Behavior normal.      UC Treatments / Results  Labs (all labs ordered are listed, but only abnormal results are displayed) Labs Reviewed  CERVICOVAGINAL ANCILLARY ONLY    EKG   Radiology No results found.  Procedures Procedures (including critical care time)  Medications Ordered in UC Medications - No data to display  Initial Impression / Assessment and Plan / UC Course  I have reviewed the triage vital signs and the nursing notes.  Pertinent labs & imaging results that were available during my care of the patient were reviewed by me and considered in my medical decision making (see chart for details).  Vaginitis -Patient states symptoms similar to previous BV infections so we will go ahead and treat empirically with Flagyl twice daily x 7 days -Await STI testing results to determine need for any further treatment  Reviewed expections re: course of current medical issues. Questions answered. Outlined signs and symptoms indicating need for more acute intervention. Pt  verbalized understanding. AVS given  Final Clinical Impressions(s) / UC Diagnoses   Final diagnoses:  Acute vaginitis     Discharge Instructions      Your STD testing has been sent to the lab and will come back in the next 2 to 3 days.  We will call you if any of your results are positive requiring treatment and treat you at that time.  As your symptoms are similar to previous BV infections we will go ahead and treat.  Avoid alcohol while taking Flagyl and take with food.  Avoid sexual intercourse until your STD results come back.  If any of your STD results are positive, you will need to avoid sexual intercourse for 7 days while you are being treated to prevent spread of STD.  Condom use is the best way to prevent spread of STDs.  Return to urgent care as needed.       ED Prescriptions     Medication Sig Dispense Auth. Provider   metroNIDAZOLE (FLAGYL) 500 MG tablet Take 1 tablet (500 mg total) by mouth 2 (two) times daily for 7 days. 14 tablet Rolla Etienne, NP      PDMP not reviewed this encounter.   Rolla Etienne, NP 02/05/22 [phone]

## 2022-02-06 LAB — CERVICOVAGINAL ANCILLARY ONLY
Bacterial Vaginitis (gardnerella): POSITIVE — AB
Candida Glabrata: NEGATIVE
Candida Vaginitis: NEGATIVE
Chlamydia: NEGATIVE
Comment: NEGATIVE
Comment: NEGATIVE
Comment: NEGATIVE
Comment: NEGATIVE
Comment: NEGATIVE
Comment: NORMAL
Neisseria Gonorrhea: NEGATIVE
Trichomonas: NEGATIVE

## 2022-04-02 ENCOUNTER — Ambulatory Visit (HOSPITAL_COMMUNITY)
Admission: EM | Admit: 2022-04-02 | Discharge: 2022-04-02 | Disposition: A | Discharge status: Home / Self Care | Payer: Medicaid Other | Attending: Internal Medicine | Admitting: Internal Medicine

## 2022-04-02 ENCOUNTER — Encounter (HOSPITAL_COMMUNITY): Payer: Self-pay

## 2022-04-02 DIAGNOSIS — N760 Acute vaginitis: Secondary | ICD-10-CM

## 2022-04-02 MED ORDER — BORIC ACID VAGINAL 600 MG VA SUPP
1.0000 | Freq: Every evening | VAGINAL | 0 refills | Status: AC
Start: 2022-04-02 — End: ?

## 2022-04-02 MED ORDER — METRONIDAZOLE 500 MG PO TABS: 500.0000 mg | ORAL_TABLET | Freq: Two times a day (BID) | ORAL | 0 refills | Status: AC

## 2022-04-02 NOTE — Discharge Instructions (Addendum)
Please take medications as prescribed Please use vaginal suppositories at bedtime Please use hypoallergenic soaps or vaginal wash Will call you with recommendation if labs are abnormal Please return to urgent care if symptoms persist or worsens. Please avoid sexual intercourse until lab results are available.

## 2022-04-02 NOTE — ED Triage Notes (Signed)
Pt states she is for vaginal odor. Pt states she completed her medication in November for BV.

## 2022-04-02 NOTE — ED Provider Notes (Signed)
[facility]    CSN: [id number] Arrival date & time: 04/02/22  1655      History   Chief Complaint Chief Complaint  Patient presents with   Vaginal Discharge    HPI Amber Hendrix is a 27 y.o. female comes to the urgent care with complaints of vaginal odor over the past few days.  She has a history of recurrent bacterial vaginosis and vaginal yeast infection.  This time the patient denies any vaginal discharge or vaginal irritation.  She denies any dysuria urgency or frequency.  No abdominal pain.  Last sexual intercourse was 4 months ago.  No nausea or vomiting.  No rash in the vaginal area.  Patient denies douching or using harsh vaginal washes.  HPI  Past Medical History:  Diagnosis Date   Ovarian cyst     There are no problems to display for this patient.   History reviewed. No pertinent surgical history.  OB History   No obstetric history on file.      Home Medications    Prior to Admission medications   Medication Sig Start Date End Date Taking? Authorizing Provider  Boric Acid Vaginal 600 MG SUPP Place 1 suppository vaginally at bedtime. 04/02/22  Yes Jaiden Dinkins, Myrene Galas, MD  metroNIDAZOLE (FLAGYL) 500 MG tablet Take 1 tablet (500 mg total) by mouth 2 (two) times daily. 04/02/22  Yes Lamyah Creed, Myrene Galas, MD    Family History History reviewed. No pertinent family history.  Social History Social History   Tobacco Use   Smoking status: Every Day   Smokeless tobacco: Never  Substance Use Topics   Alcohol use: Yes   Drug use: Not Currently     Allergies   Patient has no known allergies.   Review of Systems Review of Systems  Genitourinary: Negative.  Negative for vaginal bleeding, vaginal discharge and vaginal pain.     Physical Exam Triage Vital Signs ED Triage Vitals [04/02/22 1843]  Enc Vitals Group     BP 108/70     Pulse Rate 61     Resp 18     Temp 98.1 F (36.7 C)     Temp Source Oral     SpO2 100 %     Weight       Height      Head Circumference      Peak Flow      Pain Score      Pain Loc      Pain Edu?      Excl. in [place]?    No data found.  Updated Vital Signs BP 108/70 (BP Location: Left Arm)   Pulse 61   Temp 98.1 F (36.7 C) (Oral)   Resp 18   SpO2 100%   Visual Acuity Right Eye Distance:   Left Eye Distance:   Bilateral Distance:    Right Eye Near:   Left Eye Near:    Bilateral Near:     Physical Exam Vitals and nursing note reviewed.  Constitutional:      General: She is not in acute distress.    Appearance: She is not ill-appearing.  Cardiovascular:     Rate and Rhythm: Normal rate and regular rhythm.  Pulmonary:     Effort: Pulmonary effort is normal.     Breath sounds: Normal breath sounds.  Neurological:     Mental Status: She is alert.      UC Treatments / Results  Labs (all labs ordered are listed, but only  abnormal results are displayed) Labs Reviewed  CERVICOVAGINAL ANCILLARY ONLY    EKG   Radiology No results found.  Procedures Procedures (including critical care time)  Medications Ordered in UC Medications - No data to display  Initial Impression / Assessment and Plan / UC Course  I have reviewed the triage vital signs and the nursing notes.  Pertinent labs & imaging results that were available during my care of the patient were reviewed by me and considered in my medical decision making (see chart for details).     1.  Acute vaginitis in the setting of recurrent bacterial vaginosis: Metronidazole 500 mg twice daily for 7 days Boric acid vaginal suppository 600 mg nightly for 30 days Patient is advised to use hypoallergenic cosmetic agents or bath soaps Return to urgent care if symptoms worsen. Final Clinical Impressions(s) / UC Diagnoses   Final diagnoses:  Acute vaginitis     Discharge Instructions      Please take medications as prescribed Please use vaginal suppositories at bedtime Please use hypoallergenic soaps or vaginal  wash Will call you with recommendation if labs are abnormal Please return to urgent care if symptoms persist or worsens. Please avoid sexual intercourse until lab results are available.   ED Prescriptions     Medication Sig Dispense Auth. Provider   metroNIDAZOLE (FLAGYL) 500 MG tablet Take 1 tablet (500 mg total) by mouth 2 (two) times daily. 14 tablet Zaevion Parke, Myrene Galas, MD   Boric Acid Vaginal 600 MG SUPP Place 1 suppository vaginally at bedtime. 30 suppository Mckale Haffey, Myrene Galas, MD      PDMP not reviewed this encounter.   Chase Picket, MD 04/02/22 [phone]

## 2022-04-03 LAB — CERVICOVAGINAL ANCILLARY ONLY
Bacterial Vaginitis (gardnerella): NEGATIVE
Candida Glabrata: POSITIVE — AB
Candida Vaginitis: NEGATIVE
Comment: NEGATIVE
Comment: NEGATIVE
Comment: NEGATIVE
Comment: NEGATIVE
Trichomonas: NEGATIVE

## 2022-04-04 ENCOUNTER — Telehealth (HOSPITAL_COMMUNITY): Payer: Self-pay | Admitting: Emergency Medicine

## 2022-04-04 MED ORDER — FLUCONAZOLE 150 MG PO TABS: 150.0000 mg | ORAL_TABLET | Freq: Once | ORAL | 0 refills | Status: AC

## 2022-07-27 ENCOUNTER — Other Ambulatory Visit: Payer: Self-pay

## 2022-07-27 ENCOUNTER — Emergency Department
Admission: EM | Admit: 2022-07-27 | Discharge: 2022-07-27 | Disposition: A | Discharge status: Home / Self Care | Payer: Medicaid Other | Attending: Emergency Medicine | Admitting: Emergency Medicine

## 2022-07-27 ENCOUNTER — Encounter (HOSPITAL_COMMUNITY): Payer: Self-pay | Admitting: Emergency Medicine

## 2022-07-27 ENCOUNTER — Emergency Department (HOSPITAL_COMMUNITY)
Admission: EM | Admit: 2022-07-27 | Discharge: 2022-07-27 | Discharge status: Against Medical Advice | Payer: Medicaid Other | Attending: Emergency Medicine | Admitting: Emergency Medicine

## 2022-07-27 DIAGNOSIS — Z202 Contact with and (suspected) exposure to infections with a predominantly sexual mode of transmission: Secondary | ICD-10-CM | POA: Insufficient documentation

## 2022-07-27 DIAGNOSIS — Z5321 Procedure and treatment not carried out due to patient leaving prior to being seen by health care provider: Secondary | ICD-10-CM | POA: Diagnosis not present

## 2022-07-27 LAB — URINALYSIS, ROUTINE W REFLEX MICROSCOPIC
Bilirubin Urine: NEGATIVE
Glucose, UA: NEGATIVE mg/dL
Hgb urine dipstick: NEGATIVE
Ketones, ur: NEGATIVE mg/dL
Leukocytes,Ua: NEGATIVE
Nitrite: NEGATIVE
Protein, ur: NEGATIVE mg/dL
Specific Gravity, Urine: 1.018 (ref 1.005–1.030)
pH: 5 (ref 5.0–8.0)

## 2022-07-27 LAB — WET PREP, GENITAL
Clue Cells Wet Prep HPF POC: NONE SEEN
Sperm: NONE SEEN
Trich, Wet Prep: NONE SEEN
WBC, Wet Prep HPF POC: <10 (ref <10)
Yeast Wet Prep HPF POC: NONE SEEN

## 2022-07-27 LAB — HEPATITIS B CORE ANTIBODY, TOTAL: Hep B Core Total Ab: NONREACTIVE

## 2022-07-27 LAB — HIV ANTIBODY (ROUTINE TESTING W REFLEX): HIV Screen 4th Generation wRfx: NONREACTIVE

## 2022-07-27 LAB — POC URINE PREG, ED: Preg Test, Ur: NEGATIVE

## 2022-07-27 LAB — CHLAMYDIA/NGC RT PCR (ARMC ONLY)
Chlamydia Tr: NOT DETECTED
N gonorrhoeae: NOT DETECTED

## 2022-07-27 LAB — HEPATITIS B SURFACE ANTIGEN: Hepatitis B Surface Ag: NONREACTIVE

## 2022-07-27 NOTE — ED Triage Notes (Signed)
Pt reports that "the condom popped" two hours ago and she is "wants to be checked for STD.

## 2022-07-27 NOTE — ED Notes (Signed)
See triage note  Presents requesting STD check  States she is not having any sx's

## 2022-07-27 NOTE — ED Triage Notes (Signed)
Pt to ED from home to be STD tested. She states "I was having sex and the condom popped and I want to be checked for STD". Pt is CAOx4, in no acute distress, and ambulatory in triage.

## 2022-07-27 NOTE — Discharge Instructions (Addendum)
Follow up with the health department for repeat testing in 1 week.  You may return to the ED or health department if you develop symptoms.

## 2022-07-27 NOTE — ED Provider Notes (Signed)
[facility] Provider Note    Event Date/Time   First MD Initiated Contact with Patient 07/27/22 [phone]     (approximate)   History   Exposure to STD   HPI  Amber Hendrix is a 27 y.o. female with history of acute vaginitis, who presents to the ED today for STD testing.  Patient states that she was having sex this morning at 1 AM when the condom broke and proceeded to come to the ER directly after for testing.  She states that her sexual partner does not have any known STDs but wanted to be sure.  LMP is today.  She is unable to tell me the number of sexual partners she has had.  She has sex with both men and women.  Denies any drug use.  She denies any STI symptoms including burning, itching, and discharge.  She has not noticed any bumps or warts.  She is not on birth control.     Physical Exam   Triage Vital Signs: ED Triage Vitals [07/27/22 0240]  Enc Vitals Group     BP 118/60     Pulse Rate 82     Resp 16     Temp 98 F (36.7 C)     Temp Source Oral     SpO2 100 %     Weight 110 lb (49.9 kg)     Height 5\' 3"  (1.6 m)     Head Circumference      Peak Flow      Pain Score 0     Pain Loc      Pain Edu?      Excl. in GC?     Most recent vital signs: Vitals:   07/27/22 0240 07/27/22 0711  BP: 118/60 120/67  Pulse: 82 80  Resp: 16 16  Temp: 98 F (36.7 C) 98 F (36.7 C)  SpO2: 100% 100%   General: Awake, no distress.  CV:  Good peripheral perfusion.  RRR.  No murmurs rubs or gallops. Resp:  Normal effort.  CTA. Abd:  No distention.    ED Results / Procedures / Treatments   Labs (all labs ordered are listed, but only abnormal results are displayed) Labs Reviewed  URINALYSIS, ROUTINE W REFLEX MICROSCOPIC - Abnormal; Notable for the following components:      Result Value   Color, Urine YELLOW (*)    APPearance CLEAR (*)    All other components within normal limits  CHLAMYDIA/NGC RT PCR ([facility] ONLY)            WET PREP,  GENITAL  HIV ANTIBODY (ROUTINE TESTING W REFLEX)  RPR  HEPATITIS B SURFACE ANTIGEN  HEPATITIS B CORE ANTIBODY, TOTAL  HEPATITIS B SURFACE ANTIBODY, QUANTITATIVE  POC URINE PREG, ED    PROCEDURES:  Critical Care performed: No  Procedures   MEDICATIONS ORDERED IN ED: Medications - No data to display   IMPRESSION / MDM / ASSESSMENT AND PLAN / ED COURSE  I reviewed the triage vital signs and the nursing notes.                              Differential diagnosis includes, but is not limited to, gonorrhea, chlamydia, pregnancy, trichomonas, HIV, hepatitis, syphilis.  Patient's presentation is most consistent with acute, uncomplicated illness.  Presents to the ED today for STI testing after possible exposure this morning.  She denies any symptoms and is not  aware of her sexual partner having any STIs.  While in the ED she was tested for gonorrhea, chlamydia, trichomonas, UTI, and pregnancy, which all came back negative.  She also had testing for HIV, syphilis, and hepatitis B panel.  She was discharged before results were obtained, will receive a call if positive.  I explained that it is too soon to have a positive result from last night's exposure, so she will need to be retested in a week.  We discussed STI prevention methods through condom usage.  I explained STI symptoms to watch out for including itching, burning, pain with urination, presence of bumps or warts on the genitals, and flu like symptoms.  Patient voiced understanding.  Patient was agreeable to plan and stable at discharge.     FINAL CLINICAL IMPRESSION(S) / ED DIAGNOSES   Final diagnoses:  Possible exposure to STD     Rx / DC Orders   ED Discharge Orders     None        Note:  This document was prepared using Dragon voice recognition software and may include unintentional dictation errors.   Cruz Condon, PA-C 07/27/22 1136    Corena Herter, MD 07/27/22 [phone]

## 2022-07-27 NOTE — ED Notes (Addendum)
Pt states she has been waiting for 5-6 hours states other people have gone back ahead of her, informed pt that no one has gone in front of her and that the ER has been busy all night. Informed pt that she is waiting for a room to talk to the ED provider about her lab results.   Per registration, pt started cursing calling me a bitch and looking around the lobby to see if there were other patients here.

## 2022-07-28 LAB — RPR: RPR Ser Ql: NONREACTIVE

## 2022-07-28 LAB — HEPATITIS B SURFACE ANTIBODY, QUANTITATIVE: Hep B S AB Quant (Post): <3.5 m[IU]/mL — ABNORMAL LOW (ref >9.9)

## 2023-02-22 ENCOUNTER — Other Ambulatory Visit: Payer: Self-pay

## 2023-02-22 ENCOUNTER — Emergency Department
Admission: EM | Admit: 2023-02-22 | Discharge: 2023-02-22 | Disposition: A | Discharge status: Home / Self Care | Payer: Self-pay | Attending: Emergency Medicine | Admitting: Emergency Medicine

## 2023-02-22 DIAGNOSIS — Z23 Encounter for immunization: Secondary | ICD-10-CM | POA: Insufficient documentation

## 2023-02-22 DIAGNOSIS — S61211A Laceration without foreign body of left index finger without damage to nail, initial encounter: Secondary | ICD-10-CM | POA: Insufficient documentation

## 2023-02-22 DIAGNOSIS — X58XXXA Exposure to other specified factors, initial encounter: Secondary | ICD-10-CM | POA: Insufficient documentation

## 2023-02-22 DIAGNOSIS — S61412A Laceration without foreign body of left hand, initial encounter: Secondary | ICD-10-CM | POA: Insufficient documentation

## 2023-02-22 MED ORDER — TETANUS-DIPHTH-ACELL PERTUSSIS 5-2.5-18.5 LF-MCG/0.5 IM SUSY
0.5000 mL | PREFILLED_SYRINGE | Freq: Once | INTRAMUSCULAR | Status: AC
  Administered 2023-02-22: 0.5 mL via INTRAMUSCULAR
  Filled 2023-02-22: qty 0.5

## 2023-02-22 NOTE — ED Notes (Signed)
Called x1

## 2023-02-22 NOTE — Discharge Instructions (Signed)
You can take 650 mg of Tylenol and 600 mg of ibuprofen every 6 hours as needed for pain.  The wound was closed with surgical glue, it is important that this stays dry. You can cover with a bandage if you would like, but make sure that if it gets wet you change it out for a dry one. The glue will fall off on its own in about a week.   Watch for signs of infection including redness, warmth, swelling, pain and pus drainage.  If you develop any of these please return to the ED, urgent care or your primary care provider.

## 2023-02-22 NOTE — ED Notes (Signed)
Pt came to front desk asking when she would be seen. Pt informed we had called her 3 times for vital sign update  and treatment room. Pt advised that she was sleeping

## 2023-02-22 NOTE — ED Provider Notes (Signed)
[facility] Provider Note    Event Date/Time   First MD Initiated Contact with Patient 02/22/23 [phone]     (approximate)   History   Laceration   HPI  Amber Hendrix is a 27 y.o. female with PMH of ovarian cyst presents for evaluation of laceration to the left index finger.  Patient states she scraped her finger on a nail around 11 PM last night.  She reports that she washed it out and wrapped it up but has had ongoing pain and bleeding.  She says her tetanus shot is not up-to-date.      Physical Exam   Triage Vital Signs: ED Triage Vitals  Encounter Vitals Group     BP 02/22/23 0335 132/77     Systolic BP Percentile --      Diastolic BP Percentile --      Pulse Rate 02/22/23 0335 85     Resp 02/22/23 0335 17     Temp 02/22/23 0335 98.2 F (36.8 C)     Temp src --      SpO2 02/22/23 0335 100 %     Weight --      Height --      Head Circumference --      Peak Flow --      Pain Score 02/22/23 0325 10     Pain Loc --      Pain Education --      Exclude from Growth Chart --     Most recent vital signs: Vitals:   02/22/23 0335  BP: 132/77  Pulse: 85  Resp: 17  Temp: 98.2 F (36.8 C)  SpO2: 100%    General: Awake, no distress.  CV:  Good peripheral perfusion.  Resp:  Normal effort.  Abd:  No distention.  Other:  Superficial laceration extending from the left fingertip just past the MCP joint.  Bleeding is controlled.  Patient unable to fully bend finger due to pain.  Neurovascularly intact.   ED Results / Procedures / Treatments   Labs (all labs ordered are listed, but only abnormal results are displayed) Labs Reviewed - No data to display    PROCEDURES:  Critical Care performed: No  .Laceration Repair  Date/Time: 02/22/2023 8:14 AM  Performed by: Cameron Ali, PA-C Authorized by: Cameron Ali, PA-C   Consent:    Consent obtained:  Verbal   Consent given by:  Patient   Risks, benefits, and  alternatives were discussed: yes     Risks discussed:  Infection, pain, poor cosmetic result and poor wound healing   Alternatives discussed:  No treatment Universal protocol:    Patient identity confirmed:  Verbally with patient Anesthesia:    Anesthesia method:  None Laceration details:    Location:  Finger   Finger location:  L index finger   Length (cm):  1.5   Depth (mm):  1 Exploration:    Hemostasis achieved with:  Direct pressure   Wound exploration: wound explored through full range of motion and entire depth of wound visualized   Treatment:    Area cleansed with:  Soap and water   Amount of cleaning:  Standard   Irrigation solution:  Sterile saline   Irrigation volume:  20 mL   Irrigation method:  Syringe Skin repair:    Repair method:  Tissue adhesive Approximation:    Approximation:  Close Post-procedure details:    Dressing:  Bulky dressing   Procedure completion:  Tolerated well,  no immediate complications    MEDICATIONS ORDERED IN ED: Medications  Tdap (BOOSTRIX) injection 0.5 mL (has no administration in time range)     IMPRESSION / MDM / ASSESSMENT AND PLAN / ED COURSE  I reviewed the triage vital signs and the nursing notes.                             27 year old female presents for evaluation of left index finger last vital signs stable patient NAD on exam.  Differential diagnosis includes, but is not limited to, finger laceration, tendon injury, nerve injury, vascular injury.  Patient's presentation is most consistent with acute, uncomplicated illness.  Laceration repaired as described in the procedure note above.  Patient was instructed on wound care.  Tetanus shot was updated.  Patient was given a note for work.  She was understanding, all questions were answered and she was stable at discharge.      FINAL CLINICAL IMPRESSION(S) / ED DIAGNOSES   Final diagnoses:  Laceration of left index finger without foreign body without damage to nail,  initial encounter  Laceration of left hand, foreign body presence unspecified, initial encounter     Rx / DC Orders   ED Discharge Orders     None        Note:  This document was prepared using Dragon voice recognition software and may include unintentional dictation errors.   Cameron Ali, PA-C 02/22/23 0815    Phineas Semen, MD 02/22/23 [phone]

## 2023-02-22 NOTE — ED Notes (Signed)
NA x 2 for VS update

## 2023-02-22 NOTE — ED Triage Notes (Signed)
Pt to ED via POV C/O Lac to left index finger. Pt scraped finger on nail around 11pm last night. Pt says she washed it out and wrapped it but pain and bleeding continues. Tetanus shot not UTD per pt

## 2023-02-22 NOTE — ED Notes (Signed)
NA x 1 for VS update

## 2023-05-23 ENCOUNTER — Emergency Department
Admission: EM | Admit: 2023-05-23 | Discharge: 2023-05-23 | Disposition: A | Discharge status: Home / Self Care | Payer: Self-pay | Attending: Emergency Medicine | Admitting: Emergency Medicine

## 2023-05-23 ENCOUNTER — Other Ambulatory Visit: Payer: Self-pay

## 2023-05-23 ENCOUNTER — Emergency Department: Payer: Self-pay

## 2023-05-23 DIAGNOSIS — R519 Headache, unspecified: Secondary | ICD-10-CM | POA: Insufficient documentation

## 2023-05-23 DIAGNOSIS — M542 Cervicalgia: Secondary | ICD-10-CM | POA: Insufficient documentation

## 2023-05-23 DIAGNOSIS — M25571 Pain in right ankle and joints of right foot: Secondary | ICD-10-CM | POA: Diagnosis not present

## 2023-05-23 DIAGNOSIS — Y92410 Unspecified street and highway as the place of occurrence of the external cause: Secondary | ICD-10-CM | POA: Diagnosis not present

## 2023-05-23 DIAGNOSIS — S7002XA Contusion of left hip, initial encounter: Secondary | ICD-10-CM | POA: Insufficient documentation

## 2023-05-23 DIAGNOSIS — M25552 Pain in left hip: Secondary | ICD-10-CM | POA: Diagnosis present

## 2023-05-23 LAB — CBC
HCT: 35.6 % — ABNORMAL LOW (ref 36.0–46.0)
Hemoglobin: 11.6 g/dL — ABNORMAL LOW (ref 12.0–15.0)
MCH: 28.3 pg (ref 26.0–34.0)
MCHC: 32.6 g/dL (ref 30.0–36.0)
MCV: 86.8 fL (ref 80.0–100.0)
Platelets: 146 10*3/uL — ABNORMAL LOW (ref 150–400)
RBC: 4.1 MIL/uL (ref 3.87–5.11)
RDW: 13.6 % (ref 11.5–15.5)
WBC: 6 10*3/uL (ref 4.0–10.5)
nRBC: 0 % (ref 0.0–0.2)

## 2023-05-23 LAB — COMPREHENSIVE METABOLIC PANEL
ALT: 14 U/L (ref 0–44)
AST: 23 U/L (ref 15–41)
Albumin: 4.1 g/dL (ref 3.5–5.0)
Alkaline Phosphatase: 37 U/L — ABNORMAL LOW (ref 38–126)
Anion gap: 5 (ref 5–15)
BUN: 13 mg/dL (ref 6–20)
CO2: 24 mmol/L (ref 22–32)
Calcium: 9.1 mg/dL (ref 8.9–10.3)
Chloride: 107 mmol/L (ref 98–111)
Creatinine, Ser: 0.74 mg/dL (ref 0.44–1.00)
GFR, Estimated: >60 mL/min (ref >60)
Glucose, Bld: 89 mg/dL (ref 70–99)
Potassium: 3.5 mmol/L (ref 3.5–5.1)
Sodium: 136 mmol/L (ref 135–145)
Total Bilirubin: 0.7 mg/dL (ref 0.0–1.2)
Total Protein: 7.3 g/dL (ref 6.5–8.1)

## 2023-05-23 MED ORDER — TRAMADOL HCL 50 MG PO TABS: 50.0000 mg | ORAL_TABLET | Freq: Four times a day (QID) | ORAL | 0 refills | Status: AC | PRN

## 2023-05-23 MED ORDER — METHOCARBAMOL 500 MG PO TABS: 500.0000 mg | ORAL_TABLET | Freq: Three times a day (TID) | ORAL | 1 refills | Status: AC | PRN

## 2023-05-23 MED ORDER — NAPROXEN 500 MG PO TABS: 500.0000 mg | ORAL_TABLET | Freq: Two times a day (BID) | ORAL | 2 refills | Status: AC

## 2023-05-23 NOTE — ED Notes (Signed)
 Mother of pt came out of room and demanding to be taken to xray with pt (who is already in xray) because pt is scared. Mother then accusing staff of not allowing her to go to xray. Brandy paramedic called xray and spoke with mother.
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
## 2023-05-23 NOTE — ED Notes (Signed)
 [employer] officer at bedside talking to pt.

## 2023-11-30 ENCOUNTER — Emergency Department (HOSPITAL_COMMUNITY)
Admission: EM | Admit: 2023-11-30 | Discharge: 2023-11-30 | Disposition: A | Discharge status: Home / Self Care | Payer: Self-pay | Attending: Emergency Medicine | Admitting: Emergency Medicine

## 2023-11-30 ENCOUNTER — Encounter (HOSPITAL_COMMUNITY): Payer: Self-pay

## 2023-11-30 ENCOUNTER — Other Ambulatory Visit: Payer: Self-pay

## 2023-11-30 DIAGNOSIS — R112 Nausea with vomiting, unspecified: Secondary | ICD-10-CM | POA: Insufficient documentation

## 2023-11-30 DIAGNOSIS — R197 Diarrhea, unspecified: Secondary | ICD-10-CM | POA: Insufficient documentation

## 2023-11-30 LAB — CBC WITH DIFFERENTIAL/PLATELET
Abs Immature Granulocytes: 0.03 K/uL (ref 0.00–0.07)
Basophils Absolute: 0 K/uL (ref 0.0–0.1)
Basophils Relative: 0 %
Eosinophils Absolute: 0 K/uL (ref 0.0–0.5)
Eosinophils Relative: 0 %
HCT: 41.9 % (ref 36.0–46.0)
Hemoglobin: 12.9 g/dL (ref 12.0–15.0)
Immature Granulocytes: 0 %
Lymphocytes Relative: 22 %
Lymphs Abs: 2.3 K/uL (ref 0.7–4.0)
MCH: 26.8 pg (ref 26.0–34.0)
MCHC: 30.8 g/dL (ref 30.0–36.0)
MCV: 87.1 fL (ref 80.0–100.0)
Monocytes Absolute: 0.8 K/uL (ref 0.1–1.0)
Monocytes Relative: 8 %
Neutro Abs: 7.1 K/uL (ref 1.7–7.7)
Neutrophils Relative %: 70 %
Platelets: 191 K/uL (ref 150–400)
RBC: 4.81 MIL/uL (ref 3.87–5.11)
RDW: 15.8 % — ABNORMAL HIGH (ref 11.5–15.5)
WBC: 10.2 K/uL (ref 4.0–10.5)
nRBC: 0 % (ref 0.0–0.2)

## 2023-11-30 LAB — COMPREHENSIVE METABOLIC PANEL WITH GFR
ALT: 23 U/L (ref 0–44)
AST: 38 U/L (ref 15–41)
Albumin: 4.8 g/dL (ref 3.5–5.0)
Alkaline Phosphatase: 55 U/L (ref 38–126)
Anion gap: 23 — ABNORMAL HIGH (ref 5–15)
BUN: 13 mg/dL (ref 6–20)
CO2: 17 mmol/L — ABNORMAL LOW (ref 22–32)
Calcium: 10.2 mg/dL (ref 8.9–10.3)
Chloride: 102 mmol/L (ref 98–111)
Creatinine, Ser: 0.84 mg/dL (ref 0.44–1.00)
GFR, Estimated: >60 mL/min (ref >60)
Glucose, Bld: 95 mg/dL (ref 70–99)
Potassium: 3.6 mmol/L (ref 3.5–5.1)
Sodium: 143 mmol/L (ref 135–145)
Total Bilirubin: 0.6 mg/dL (ref 0.0–1.2)
Total Protein: 8.3 g/dL — ABNORMAL HIGH (ref 6.5–8.1)

## 2023-11-30 LAB — BASIC METABOLIC PANEL WITH GFR
Anion gap: 13 (ref 5–15)
BUN: 13 mg/dL (ref 6–20)
CO2: 21 mmol/L — ABNORMAL LOW (ref 22–32)
Calcium: 9.2 mg/dL (ref 8.9–10.3)
Chloride: 106 mmol/L (ref 98–111)
Creatinine, Ser: 0.75 mg/dL (ref 0.44–1.00)
GFR, Estimated: >60 mL/min (ref >60)
Glucose, Bld: 101 mg/dL — ABNORMAL HIGH (ref 70–99)
Potassium: 4.1 mmol/L (ref 3.5–5.1)
Sodium: 140 mmol/L (ref 135–145)

## 2023-11-30 LAB — HCG, SERUM, QUALITATIVE: Preg, Serum: NEGATIVE

## 2023-11-30 LAB — LIPASE, BLOOD: Lipase: 12 U/L (ref 11–51)

## 2023-11-30 MED ORDER — ONDANSETRON HCL 4 MG/2ML IJ SOLN
INTRAMUSCULAR | Status: AC
  Administered 2023-11-30: 4 mg via INTRAVENOUS
  Filled 2023-11-30: qty 2

## 2023-11-30 MED ORDER — ONDANSETRON HCL 4 MG/2ML IJ SOLN: 4.0000 mg | Freq: Once | INTRAMUSCULAR | Status: AC

## 2023-11-30 MED ORDER — SODIUM CHLORIDE 0.9 % IV BOLUS
1000.0000 mL | Freq: Once | INTRAVENOUS | Status: AC
  Administered 2023-11-30: 1000 mL via INTRAVENOUS

## 2023-11-30 NOTE — ED Notes (Signed)
 Given juice per request. IVF infusing.

## 2023-11-30 NOTE — ED Notes (Signed)
 Pt removed her own IV while this nurse was in another room and left.

## 2023-11-30 NOTE — Discharge Instructions (Signed)
 Amber Hendrix  Thank you for allowing us  to take care of you today.  You came to the Emergency Department today because of abdominal pain after drinking last night.  You are feeling better after some fluids here in the emergency department, your labs initially showed that you are dehydrated, however looking better after fluids.  We recommend no more than 7 drinks in a week or 3 drinks in a setting per the CDC guidelines.  To-Do: 1. Please follow-up with your primary doctor as needed.   Please return to the Emergency Department or call 911 if you experience have worsening of your symptoms, or do not get better, chest pain, shortness of breath, severe or significantly worsening pain, high fever, severe confusion, pass out or have any reason to think that you need emergency medical care.   We hope you feel better soon.   Department of Emergency Medicine [facility]

## 2023-11-30 NOTE — ED Notes (Signed)
 Pt relates sx to ETOH use last night. Sx onset last night after drinking ETOH. Endorses abd pain, NVD. Denies fever, or urinary sx.

## 2023-11-30 NOTE — ED Triage Notes (Signed)
 Pt reports with abdominal pain and vomiting after drinking last night. Pt states that she feels like it is alcohol poisoning.

## 2023-11-30 NOTE — ED Notes (Signed)
 Pt refused vitals

## 2023-11-30 NOTE — ED Notes (Signed)
 Attempted to get pts blood but she was a hard stick and she stated she had been vomiting since last night. Pt repeated stated I can't do this, I can't do this. Pt then got loud with this medic and yelled at me. I exited the room and another staff member assumed care and moved her to the lobby to wait for a room. Pt repeatedly asked for something to drink and was repeatedly told she can not have anything to eat or drink before a provider sees her and especially since she is actively vomiting.
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
